# Patient Record
Sex: Male | Born: 1960 | Race: Black or African American | Hispanic: No | Smoking: Former smoker
Health system: Southern US, Community
[De-identification: ages and names within clinical notes are randomized; demographics above are authoritative.]

## PROBLEM LIST (undated history)

## (undated) DIAGNOSIS — I499 Cardiac arrhythmia, unspecified: Secondary | ICD-10-CM

## (undated) DIAGNOSIS — F17200 Nicotine dependence, unspecified, uncomplicated: Secondary | ICD-10-CM

---
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
## 2018-10-25 DIAGNOSIS — R490 Dysphonia: Secondary | ICD-10-CM | POA: Diagnosis present

## 2018-12-01 ENCOUNTER — Emergency Department (HOSPITAL_COMMUNITY): Payer: Self-pay | Admitting: Anesthesiology

## 2018-12-01 ENCOUNTER — Emergency Department (HOSPITAL_COMMUNITY): Payer: Self-pay

## 2018-12-01 ENCOUNTER — Encounter (HOSPITAL_COMMUNITY)
Admission: EM | Disposition: A | Discharge status: Home / Self Care | Payer: Self-pay | Source: Home / Self Care | Attending: Orthopedic Surgery

## 2018-12-01 ENCOUNTER — Encounter (HOSPITAL_COMMUNITY): Payer: Self-pay | Admitting: Emergency Medicine

## 2018-12-01 ENCOUNTER — Other Ambulatory Visit: Payer: Self-pay

## 2018-12-01 ENCOUNTER — Inpatient Hospital Stay (HOSPITAL_COMMUNITY)
Admission: EM | Admit: 2018-12-01 | Discharge: 2018-12-04 | DRG: 493 | Disposition: A | Discharge status: Home / Self Care | Payer: Self-pay | Attending: Orthopedic Surgery | Admitting: Orthopedic Surgery

## 2018-12-01 DIAGNOSIS — R609 Edema, unspecified: Secondary | ICD-10-CM

## 2018-12-01 DIAGNOSIS — S82309A Unspecified fracture of lower end of unspecified tibia, initial encounter for closed fracture: Secondary | ICD-10-CM | POA: Diagnosis present

## 2018-12-01 DIAGNOSIS — Z09 Encounter for follow-up examination after completed treatment for conditions other than malignant neoplasm: Secondary | ICD-10-CM

## 2018-12-01 DIAGNOSIS — D62 Acute posthemorrhagic anemia: Secondary | ICD-10-CM | POA: Diagnosis not present

## 2018-12-01 DIAGNOSIS — Z23 Encounter for immunization: Secondary | ICD-10-CM

## 2018-12-01 DIAGNOSIS — F17200 Nicotine dependence, unspecified, uncomplicated: Secondary | ICD-10-CM | POA: Diagnosis present

## 2018-12-01 DIAGNOSIS — F17210 Nicotine dependence, cigarettes, uncomplicated: Secondary | ICD-10-CM | POA: Diagnosis present

## 2018-12-01 DIAGNOSIS — S82832A Other fracture of upper and lower end of left fibula, initial encounter for closed fracture: Secondary | ICD-10-CM | POA: Diagnosis present

## 2018-12-01 DIAGNOSIS — Z20828 Contact with and (suspected) exposure to other viral communicable diseases: Secondary | ICD-10-CM | POA: Diagnosis present

## 2018-12-01 DIAGNOSIS — S82409A Unspecified fracture of shaft of unspecified fibula, initial encounter for closed fracture: Secondary | ICD-10-CM | POA: Diagnosis present

## 2018-12-01 DIAGNOSIS — Y92018 Other place in single-family (private) house as the place of occurrence of the external cause: Secondary | ICD-10-CM

## 2018-12-01 DIAGNOSIS — Y93H3 Activity, building and construction: Secondary | ICD-10-CM

## 2018-12-01 DIAGNOSIS — Z419 Encounter for procedure for purposes other than remedying health state, unspecified: Secondary | ICD-10-CM

## 2018-12-01 DIAGNOSIS — S82892A Other fracture of left lower leg, initial encounter for closed fracture: Secondary | ICD-10-CM

## 2018-12-01 DIAGNOSIS — K219 Gastro-esophageal reflux disease without esophagitis: Secondary | ICD-10-CM | POA: Diagnosis present

## 2018-12-01 DIAGNOSIS — R490 Dysphonia: Secondary | ICD-10-CM | POA: Diagnosis present

## 2018-12-01 DIAGNOSIS — S82872A Displaced pilon fracture of left tibia, initial encounter for closed fracture: Principal | ICD-10-CM | POA: Diagnosis present

## 2018-12-01 DIAGNOSIS — W19XXXA Unspecified fall, initial encounter: Secondary | ICD-10-CM

## 2018-12-01 DIAGNOSIS — W11XXXA Fall on and from ladder, initial encounter: Secondary | ICD-10-CM | POA: Diagnosis present

## 2018-12-01 DIAGNOSIS — E8889 Other specified metabolic disorders: Secondary | ICD-10-CM | POA: Diagnosis present

## 2018-12-01 HISTORY — PX: EXTERNAL FIXATION LEG: SHX1549

## 2018-12-01 LAB — I-STAT CHEM 8, ED
BUN: 15 mg/dL (ref 6–20)
Calcium, Ion: 1.18 mmol/L (ref 1.15–1.40)
Chloride: 101 mmol/L (ref 98–111)
Creatinine, Ser: 1.1 mg/dL (ref 0.61–1.24)
Glucose, Bld: 128 mg/dL — ABNORMAL HIGH (ref 70–99)
HCT: 49 % (ref 39.0–52.0)
Hemoglobin: 16.7 g/dL (ref 13.0–17.0)
Potassium: 3.9 mmol/L (ref 3.5–5.1)
Sodium: 139 mmol/L (ref 135–145)
TCO2: 26 mmol/L (ref 22–32)

## 2018-12-01 LAB — COMPREHENSIVE METABOLIC PANEL
ALT: 26 U/L (ref 0–44)
AST: 26 U/L (ref 15–41)
Albumin: 4.3 g/dL (ref 3.5–5.0)
Alkaline Phosphatase: 54 U/L (ref 38–126)
Anion gap: 13 (ref 5–15)
BUN: 13 mg/dL (ref 6–20)
CO2: 24 mmol/L (ref 22–32)
Calcium: 9.7 mg/dL (ref 8.9–10.3)
Chloride: 100 mmol/L (ref 98–111)
Creatinine, Ser: 1.16 mg/dL (ref 0.61–1.24)
GFR calc Af Amer: >60 mL/min (ref >60)
GFR calc non Af Amer: >60 mL/min (ref >60)
Glucose, Bld: 132 mg/dL — ABNORMAL HIGH (ref 70–99)
Potassium: 4 mmol/L (ref 3.5–5.1)
Sodium: 137 mmol/L (ref 135–145)
Total Bilirubin: 0.7 mg/dL (ref 0.3–1.2)
Total Protein: 8.4 g/dL — ABNORMAL HIGH (ref 6.5–8.1)

## 2018-12-01 LAB — CBC
HCT: 45.6 % (ref 39.0–52.0)
Hemoglobin: 15.3 g/dL (ref 13.0–17.0)
MCH: 30 pg (ref 26.0–34.0)
MCHC: 33.6 g/dL (ref 30.0–36.0)
MCV: 89.4 fL (ref 80.0–100.0)
Platelets: 272 10*3/uL (ref 150–400)
RBC: 5.1 MIL/uL (ref 4.22–5.81)
RDW: 14.3 % (ref 11.5–15.5)
WBC: 8.7 10*3/uL (ref 4.0–10.5)
nRBC: 0 % (ref 0.0–0.2)

## 2018-12-01 LAB — SARS CORONAVIRUS 2 BY RT PCR (HOSPITAL ORDER, PERFORMED IN ~~LOC~~ HOSPITAL LAB): SARS Coronavirus 2: NEGATIVE

## 2018-12-01 LAB — SAMPLE TO BLOOD BANK

## 2018-12-01 LAB — URINALYSIS, ROUTINE W REFLEX MICROSCOPIC
Bilirubin Urine: NEGATIVE
Glucose, UA: NEGATIVE mg/dL
Hgb urine dipstick: NEGATIVE
Ketones, ur: NEGATIVE mg/dL
Leukocytes,Ua: NEGATIVE
Nitrite: NEGATIVE
Protein, ur: NEGATIVE mg/dL
Specific Gravity, Urine: >1.046 — ABNORMAL HIGH (ref 1.005–1.030)
pH: 5 (ref 5.0–8.0)

## 2018-12-01 LAB — CDS SEROLOGY

## 2018-12-01 LAB — ETHANOL: Alcohol, Ethyl (B): <10 mg/dL (ref <10)

## 2018-12-01 LAB — LACTIC ACID, PLASMA: Lactic Acid, Venous: 1.3 mmol/L (ref 0.5–1.9)

## 2018-12-01 LAB — PROTIME-INR
INR: 1 (ref 0.8–1.2)
Prothrombin Time: 12.7 seconds (ref 11.4–15.2)

## 2018-12-01 SURGERY — EXTERNAL FIXATION, LOWER EXTREMITY
Anesthesia: General | Site: Ankle | Laterality: Left

## 2018-12-01 MED ORDER — MORPHINE SULFATE (PF) 4 MG/ML IV SOLN
4.0000 mg | Freq: Once | INTRAVENOUS | Status: AC
  Administered 2018-12-01: 17:00:00 4 mg via INTRAVENOUS
  Filled 2018-12-01: qty 1

## 2018-12-01 MED ORDER — ONDANSETRON HCL 4 MG/2ML IJ SOLN
INTRAMUSCULAR | Status: DC | PRN
  Administered 2018-12-01: 4 mg via INTRAVENOUS

## 2018-12-01 MED ORDER — ONDANSETRON HCL 4 MG/2ML IJ SOLN
INTRAMUSCULAR | Status: AC
  Filled 2018-12-01: qty 2

## 2018-12-01 MED ORDER — HYDROMORPHONE HCL 1 MG/ML IJ SOLN: 0.2500 mg | INTRAMUSCULAR | Status: DC | PRN

## 2018-12-01 MED ORDER — ARTIFICIAL TEARS OPHTHALMIC OINT
TOPICAL_OINTMENT | OPHTHALMIC | Status: AC
  Filled 2018-12-01: qty 3.5

## 2018-12-01 MED ORDER — SODIUM CHLORIDE (PF) 0.9 % IJ SOLN
INTRAMUSCULAR | Status: AC
  Filled 2018-12-01: qty 10

## 2018-12-01 MED ORDER — SUCCINYLCHOLINE CHLORIDE 20 MG/ML IJ SOLN
INTRAMUSCULAR | Status: DC | PRN
  Administered 2018-12-01: 120 mg via INTRAVENOUS

## 2018-12-01 MED ORDER — PROPOFOL 10 MG/ML IV BOLUS
INTRAVENOUS | Status: DC | PRN
  Administered 2018-12-01: 160 mg via INTRAVENOUS

## 2018-12-01 MED ORDER — SUGAMMADEX SODIUM 200 MG/2ML IV SOLN
INTRAVENOUS | Status: DC | PRN
  Administered 2018-12-01: 200 mg via INTRAVENOUS

## 2018-12-01 MED ORDER — IOHEXOL 300 MG/ML  SOLN
100.0000 mL | Freq: Once | INTRAMUSCULAR | Status: AC | PRN
  Administered 2018-12-01: 100 mL via INTRAVENOUS

## 2018-12-01 MED ORDER — LACTATED RINGERS IV SOLN: INTRAVENOUS | Status: DC

## 2018-12-01 MED ORDER — HYDROMORPHONE HCL 1 MG/ML IJ SOLN
1.0000 mg | Freq: Once | INTRAMUSCULAR | Status: AC
  Administered 2018-12-01: 18:00:00 1 mg via INTRAVENOUS
  Filled 2018-12-01: qty 1

## 2018-12-01 MED ORDER — BACITRACIN ZINC 500 UNIT/GM EX OINT
TOPICAL_OINTMENT | CUTANEOUS | Status: AC
  Filled 2018-12-01: qty 28.35

## 2018-12-01 MED ORDER — PROMETHAZINE HCL 25 MG/ML IJ SOLN: 6.2500 mg | INTRAMUSCULAR | Status: DC | PRN

## 2018-12-01 MED ORDER — ROCURONIUM BROMIDE 10 MG/ML (PF) SYRINGE
PREFILLED_SYRINGE | INTRAVENOUS | Status: AC
  Filled 2018-12-01: qty 20

## 2018-12-01 MED ORDER — DEXAMETHASONE SODIUM PHOSPHATE 10 MG/ML IJ SOLN
INTRAMUSCULAR | Status: DC | PRN
  Administered 2018-12-01: 10 mg via INTRAVENOUS

## 2018-12-01 MED ORDER — STERILE WATER FOR IRRIGATION IR SOLN
Status: DC | PRN
  Administered 2018-12-01: 1000 mL

## 2018-12-01 MED ORDER — FENTANYL CITRATE (PF) 250 MCG/5ML IJ SOLN
INTRAMUSCULAR | Status: AC
  Filled 2018-12-01: qty 5

## 2018-12-01 MED ORDER — ROCURONIUM BROMIDE 100 MG/10ML IV SOLN
INTRAVENOUS | Status: DC | PRN
  Administered 2018-12-01: 40 mg via INTRAVENOUS

## 2018-12-01 MED ORDER — MIDAZOLAM HCL 2 MG/2ML IJ SOLN
INTRAMUSCULAR | Status: AC
  Filled 2018-12-01: qty 2

## 2018-12-01 MED ORDER — SODIUM CHLORIDE 0.9 % IV BOLUS
1000.0000 mL | Freq: Once | INTRAVENOUS | Status: AC
  Administered 2018-12-01: 1000 mL via INTRAVENOUS

## 2018-12-01 MED ORDER — 0.9 % SODIUM CHLORIDE (POUR BTL) OPTIME
TOPICAL | Status: DC | PRN
  Administered 2018-12-01: 22:00:00 1000 mL

## 2018-12-01 MED ORDER — ALBUTEROL SULFATE (2.5 MG/3ML) 0.083% IN NEBU
INHALATION_SOLUTION | RESPIRATORY_TRACT | Status: AC
  Filled 2018-12-01: qty 3

## 2018-12-01 MED ORDER — CEFAZOLIN SODIUM-DEXTROSE 2-3 GM-%(50ML) IV SOLR
INTRAVENOUS | Status: DC | PRN
  Administered 2018-12-01: 2 g via INTRAVENOUS

## 2018-12-01 MED ORDER — PROPOFOL 10 MG/ML IV BOLUS
INTRAVENOUS | Status: AC
  Filled 2018-12-01: qty 40

## 2018-12-01 MED ORDER — ACETAMINOPHEN 10 MG/ML IV SOLN: 1000.0000 mg | Freq: Once | INTRAVENOUS | Status: DC | PRN

## 2018-12-01 MED ORDER — MEPERIDINE HCL 25 MG/ML IJ SOLN: 6.2500 mg | INTRAMUSCULAR | Status: DC | PRN

## 2018-12-01 MED ORDER — MIDAZOLAM HCL 5 MG/5ML IJ SOLN
INTRAMUSCULAR | Status: DC | PRN
  Administered 2018-12-01: 2 mg via INTRAVENOUS

## 2018-12-01 MED ORDER — FENTANYL CITRATE (PF) 250 MCG/5ML IJ SOLN
INTRAMUSCULAR | Status: DC | PRN
  Administered 2018-12-01: 100 ug via INTRAVENOUS
  Administered 2018-12-01: 50 ug via INTRAVENOUS
  Administered 2018-12-01: 100 ug via INTRAVENOUS

## 2018-12-01 MED ORDER — SUCCINYLCHOLINE CHLORIDE 200 MG/10ML IV SOSY
PREFILLED_SYRINGE | INTRAVENOUS | Status: AC
  Filled 2018-12-01: qty 10

## 2018-12-01 MED ORDER — ACETAMINOPHEN 325 MG PO TABS: 325.0000 mg | ORAL_TABLET | Freq: Once | ORAL | Status: DC | PRN

## 2018-12-01 MED ORDER — LIDOCAINE HCL (CARDIAC) PF 100 MG/5ML IV SOSY
PREFILLED_SYRINGE | INTRAVENOUS | Status: DC | PRN
  Administered 2018-12-01: 80 mg via INTRATRACHEAL

## 2018-12-01 MED ORDER — ACETAMINOPHEN 160 MG/5ML PO SOLN: 325.0000 mg | Freq: Once | ORAL | Status: DC | PRN

## 2018-12-01 MED ORDER — DEXAMETHASONE SODIUM PHOSPHATE 10 MG/ML IJ SOLN
INTRAMUSCULAR | Status: AC
  Filled 2018-12-01: qty 1

## 2018-12-01 MED ORDER — CEFAZOLIN SODIUM 1 G IJ SOLR
INTRAMUSCULAR | Status: AC
  Filled 2018-12-01: qty 20

## 2018-12-01 MED ORDER — ALBUTEROL SULFATE (2.5 MG/3ML) 0.083% IN NEBU
2.5000 mg | INHALATION_SOLUTION | Freq: Once | RESPIRATORY_TRACT | Status: AC
  Administered 2018-12-01: 2.5 mg via RESPIRATORY_TRACT

## 2018-12-01 MED ORDER — LACTATED RINGERS IV SOLN
INTRAVENOUS | Status: DC | PRN
  Administered 2018-12-01: 22:00:00 via INTRAVENOUS

## 2018-12-01 SURGICAL SUPPLY — 58 items
BAR GLASS FIBER EXFX 11X350 (EXFIX) ×4 IMPLANT
BIT DRILL CANN MED FLUTE 4.0 (BIT) ×1 IMPLANT
BLADE SURG 10 STRL SS (BLADE) IMPLANT
BNDG COHESIVE 4X5 TAN STRL (GAUZE/BANDAGES/DRESSINGS) IMPLANT
BNDG COHESIVE 6X5 TAN STRL LF (GAUZE/BANDAGES/DRESSINGS) IMPLANT
BNDG ELASTIC 4X5.8 VLCR STR LF (GAUZE/BANDAGES/DRESSINGS) ×2 IMPLANT
BNDG GAUZE ELAST 4 BULKY (GAUZE/BANDAGES/DRESSINGS) ×2 IMPLANT
CANISTER SUCT 3000ML PPV (MISCELLANEOUS) ×2 IMPLANT
CHLORAPREP W/TINT 26 (MISCELLANEOUS) ×2 IMPLANT
CLAMP BLUE BAR TO PIN (EXFIX) ×4 IMPLANT
COVER SURGICAL LIGHT HANDLE (MISCELLANEOUS) ×2 IMPLANT
COVER WAND RF STERILE (DRAPES) IMPLANT
CUFF TOURN SGL QUICK 34 (TOURNIQUET CUFF) ×1
CUFF TOURN SGL QUICK 42 (TOURNIQUET CUFF) IMPLANT
CUFF TRNQT CYL 34X4.125X (TOURNIQUET CUFF) ×1 IMPLANT
DRAPE C-ARM 42X72 X-RAY (DRAPES) ×2 IMPLANT
DRAPE C-ARMOR (DRAPES) ×2 IMPLANT
DRAPE U-SHAPE 47X51 STRL (DRAPES) ×2 IMPLANT
DRILL CANN 4.0MM (BIT) ×2
DRSG ADAPTIC 3X8 NADH LF (GAUZE/BANDAGES/DRESSINGS) IMPLANT
DRSG PAD ABDOMINAL 8X10 ST (GAUZE/BANDAGES/DRESSINGS) IMPLANT
ELECT REM PT RETURN 9FT ADLT (ELECTROSURGICAL) ×2
ELECTRODE REM PT RTRN 9FT ADLT (ELECTROSURGICAL) ×1 IMPLANT
GAUZE SPONGE 4X4 12PLY STRL (GAUZE/BANDAGES/DRESSINGS) IMPLANT
GLOVE BIO SURGEON STRL SZ8 (GLOVE) ×4 IMPLANT
GLOVE BIOGEL PI IND STRL 8 (GLOVE) ×3 IMPLANT
GLOVE BIOGEL PI INDICATOR 8 (GLOVE) ×3
GLOVE ECLIPSE 8.0 STRL XLNG CF (GLOVE) IMPLANT
GLOVE SURG SS PI 6.5 STRL IVOR (GLOVE) ×2 IMPLANT
GLOVE SURG SS PI 7.0 STRL IVOR (GLOVE) ×2 IMPLANT
GOWN STRL REUS W/ TWL LRG LVL3 (GOWN DISPOSABLE) ×2 IMPLANT
GOWN STRL REUS W/ TWL XL LVL3 (GOWN DISPOSABLE) ×2 IMPLANT
GOWN STRL REUS W/TWL LRG LVL3 (GOWN DISPOSABLE) ×2
GOWN STRL REUS W/TWL XL LVL3 (GOWN DISPOSABLE) ×2
KIT BASIN OR (CUSTOM PROCEDURE TRAY) ×2 IMPLANT
KIT TURNOVER KIT B (KITS) ×2 IMPLANT
NEEDLE 22X1 1/2 (OR ONLY) (NEEDLE) IMPLANT
NS IRRIG 1000ML POUR BTL (IV SOLUTION) ×2 IMPLANT
PACK ORTHO EXTREMITY (CUSTOM PROCEDURE TRAY) ×2 IMPLANT
PAD ARMBOARD 7.5X6 YLW CONV (MISCELLANEOUS) ×2 IMPLANT
PAD CAST 4YDX4 CTTN HI CHSV (CAST SUPPLIES) IMPLANT
PADDING CAST COTTON 4X4 STRL (CAST SUPPLIES)
PIN 6X200 (EXFIX) ×2 IMPLANT
PIN CLAMP 2BAR 75MM BLUE (EXFIX) ×2 IMPLANT
PIN HALF YELLOW 5X160X35 (EXFIX) ×4 IMPLANT
SOAP 2 % CHG 4 OZ (WOUND CARE) IMPLANT
STAPLER VISISTAT 35W (STAPLE) IMPLANT
SUCTION FRAZIER HANDLE 10FR (MISCELLANEOUS)
SUCTION TUBE FRAZIER 10FR DISP (MISCELLANEOUS) IMPLANT
SUT ETHILON 3 0 PS 1 (SUTURE) ×2 IMPLANT
SUT PROLENE 3 0 PS 2 (SUTURE) IMPLANT
SUT VIC AB 3-0 PS2 18 (SUTURE)
SUT VIC AB 3-0 PS2 18XBRD (SUTURE) IMPLANT
SYR CONTROL 10ML LL (SYRINGE) IMPLANT
TOWEL GREEN STERILE (TOWEL DISPOSABLE) ×2 IMPLANT
TOWEL GREEN STERILE FF (TOWEL DISPOSABLE) ×2 IMPLANT
TUBE CONNECTING 12X1/4 (SUCTIONS) IMPLANT
WATER STERILE IRR 1000ML POUR (IV SOLUTION) ×2 IMPLANT

## 2018-12-01 NOTE — ED Notes (Signed)
Patient transported to CT 

## 2018-12-01 NOTE — Anesthesia Procedure Notes (Signed)
Procedure Name: Intubation Date/Time: 12/01/2018 10:41 PM Performed by: Clovis Cao, CRNA Pre-anesthesia Checklist: Patient identified, Emergency Drugs available, Suction available, Patient being monitored and Timeout performed Patient Re-evaluated:Patient Re-evaluated prior to induction Oxygen Delivery Method: Circle system utilized Preoxygenation: Pre-oxygenation with 100% oxygen Induction Type: IV induction, Rapid sequence and Cricoid Pressure applied Laryngoscope Size: Miller and 2 Grade View: Grade I Tube type: Oral Tube size: 7.5 mm Number of attempts: 1 Airway Equipment and Method: Stylet Placement Confirmation: ETT inserted through vocal cords under direct vision,  positive ETCO2 and breath sounds checked- equal and bilateral Secured at: 22 cm Tube secured with: Tape Dental Injury: Teeth and Oropharynx as per pre-operative assessment

## 2018-12-01 NOTE — Transfer of Care (Signed)
Immediate Anesthesia Transfer of Care Note  Patient: Kevin Powers  Procedure(s) Performed: CLOSED REDUCTION, EXTERNAL FIXATION LEFT ANKLE (Left )  Patient Location: PACU  Anesthesia Type:General  Level of Consciousness: drowsy  Airway & Oxygen Therapy: Patient Spontanous Breathing and Patient connected to nasal cannula oxygen  Post-op Assessment: Report given to RN and Post -op Vital signs reviewed and stable  Post vital signs: Reviewed and stable  Last Vitals:  Vitals Value Taken Time  BP 156/114 12/01/18 2345  Temp 36.5 C 12/01/18 2340  Pulse 90 12/01/18 2350  Resp 21 12/01/18 2350  SpO2 98 % 12/01/18 2350  Vitals shown include unvalidated device data.  Last Pain:  Vitals:   12/01/18 1733  PainSc: 10-Worst pain ever         Complications: No apparent anesthesia complications

## 2018-12-01 NOTE — Progress Notes (Signed)
Orthopedic Tech Progress Note Patient Details:  Kevin Powers 03-10-1960 [id number]  Level 2 trauma Patient ID: [name], male   DOB: September 29, 1960, 58 y.o.   MRN: [id number]   [name] 12/01/2018, 5:10 PM

## 2018-12-01 NOTE — Op Note (Signed)
12/01/2018  11:27 PM  PATIENT:  Kevin Powers  58 y.o. male  PRE-OPERATIVE DIAGNOSIS: 1.  Left distal tibia intra-articular fracture      2.  Left fibula lateral malleolus fracture  POST-OPERATIVE DIAGNOSIS:  same  Procedure(s): 1.  Closed treatment of left distal tibia intra-articular fracture with manipulation under anesthesia   2.  Closed treatment of left distal fibula fracture with manipulation under anesthesia   3.  Application of multi plane external fixation to left lower extremity  SURGEON:  Wylene Simmer, MD  ASSISTANT: Mechele Claude, PA-C  ANESTHESIA:   General  EBL:  minimal   TOURNIQUET:  none  COMPLICATIONS:  None apparent  DISPOSITION:  Extubated, awake and stable to recovery.  INDICATION FOR PROCEDURE: The patient is a 58 year old male with past medical history significant for smoking.  He fell from a ladder earlier today injuring his left lower extremity.  Plain films and a CT scan show a comminuted distal tibia pilon fracture and a comminuted lateral malleolus fracture of the fibula.  He presents now for closed reduction and application of an external fixator.  The risks and benefits of the alternative treatment options have been discussed in detail.  The patient wishes to proceed with surgery and specifically understands risks of bleeding, infection, nerve damage, blood clots, need for additional surgery, amputation and death.  PROCEDURE IN DETAIL: After preoperative consent was obtained and the correct operative site was identified, the patient was brought to the operating room and placed upon the operating table.  Preoperative antibiotics were administered.  A surgical timeout was taken.  General anesthesia was administered.  The left lower extremity was then prepped and draped in standard sterile fashion.  2 Schanz pins were inserted in the tibia after predrilling.  A pin to bar clamp was applied and provisionally tightened.  A stab incision was made at the lateral wall  of the calcaneus at the isthmus of the tuberosity.  A centrally threaded transfixion pin was then inserted taking care to avoid the neurovascular bundle medially.  Pin to bar clamps were applied.  Carbon fiber rods from the Zimmer large ex-fix set were attached.  Longitudinal traction was applied along with medial and posterior translation of the foot relative to the leg.  AP and lateral radiographs showed appropriate alignment of the tibiotalar joint and appropriate length of the tibia and fibula.  The clamps were all securely tightened.  The drill tip was cut off with bolt cutters.  All metal prominences were capped.  The wounds were irrigated.  Sterile dressings were applied around the pins followed by Ace wrap around the foot and leg.  The patient was then awakened from anesthesia and transported to the recovery room in stable condition.  FOLLOW UP PLAN: The patient will be admitted for pain control overnight and for physical therapy and a CT scan tomorrow morning.  He will require a return to the operating room for staged treatment of this severely comminuted intra-articular fractures of the distal tibia and fibula.    Mechele Claude PA-C was present and scrubbed for the duration of the operative case. His assistance was essential in positioning the patient, prepping and draping, gaining and maintaining exposure, performing the operation, closing and dressing the wounds and applying the splint.

## 2018-12-01 NOTE — ED Notes (Signed)
Pt remains alert and oriented.  C/o pain remaining a '10", additional orders given per MD.

## 2018-12-01 NOTE — ED Provider Notes (Signed)
[facility] Provider Note   CSN: [id number] Arrival date & time: 12/01/18  1620     History   Chief Complaint No chief complaint on file.   HPI Aris GeorgiaJohn Jerrett is a 58 y.o. male here presenting with 15 foot fall.  Patient states that he was trying to take out a window and was on top of the ladder and slipped and fell backwards.  He states that he fell directly on his left foot and was noted to have a obvious left distal tib-fib deformity.  He denies any head injury and denies any back pain or other injuries.  Patient states that he is not on any blood thinners currently.     The history is provided by the patient.    History reviewed. No pertinent past medical history.  There are no active problems to display for this patient.   History reviewed. No pertinent surgical history.      Home Medications    Prior to Admission medications   Not on File    Family History No family history on file.  Social History Social History   Tobacco Use  . Smoking status: Not on file  Substance Use Topics  . Alcohol use: Not on file  . Drug use: Not on file     Allergies   Patient has no allergy information on record.   Review of Systems Review of Systems  Musculoskeletal:       L leg pain   All other systems reviewed and are negative.    Physical Exam Updated Vital Signs BP 135/90   Pulse 73   Resp 11   SpO2 93%   Physical Exam Vitals signs and nursing note reviewed.  Constitutional:      Comments: Uncomfortable   HENT:     Head: Normocephalic.     Comments: No obvious scalp hematoma     Mouth/Throat:     Mouth: Mucous membranes are moist.  Eyes:     Extraocular Movements: Extraocular movements intact.     Pupils: Pupils are equal, round, and reactive to light.  Neck:     Musculoskeletal: Normal range of motion.  Cardiovascular:     Rate and Rhythm: Normal rate and regular rhythm.     Pulses: Normal pulses.     Heart  sounds: Normal heart sounds.  Pulmonary:     Effort: Pulmonary effort is normal.     Breath sounds: Normal breath sounds.  Abdominal:     General: Abdomen is flat.     Palpations: Abdomen is soft.     Comments: No obvious abdominal bruising or ecchymosis   Musculoskeletal:     Comments: No obvious spinal tenderness or deformity. Obvious L distal tib/fib deformity and L ankle deformity. Good DP pulse able to wiggle toes.   Skin:    General: Skin is warm.     Capillary Refill: Capillary refill takes less than 2 seconds.  Neurological:     General: No focal deficit present.  Psychiatric:        Mood and Affect: Mood normal.        Behavior: Behavior normal.      ED Treatments / Results  Labs (all labs ordered are listed, but only abnormal results are displayed) Labs Reviewed  COMPREHENSIVE METABOLIC PANEL - Abnormal; Notable for the following components:      Result Value   Glucose, Bld 132 (*)    Total Protein 8.4 (*)  All other components within normal limits  I-STAT CHEM 8, ED - Abnormal; Notable for the following components:   Glucose, Bld 128 (*)    All other components within normal limits  CDS SEROLOGY  CBC  ETHANOL  LACTIC ACID, PLASMA  PROTIME-INR  URINALYSIS, ROUTINE W REFLEX MICROSCOPIC  SAMPLE TO BLOOD BANK    EKG None  Radiology Dg Pelvis Portable  Result Date: 12/01/2018 CLINICAL DATA:  Fall from second story. EXAM: PORTABLE PELVIS 1-2 VIEWS COMPARISON:  None. FINDINGS: Frontal pelvis shows no fracture. No subluxation or dislocation. SI joints and symphysis pubis are unremarkable. Radiopaque debris overlies the right hemipelvis, presumably external to the patient. Foreign bodies also project over the proximal right thigh. IMPRESSION: Negative. Electronically Signed   By: Kennith Center M.D.   On: 12/01/2018 17:27   Dg Chest Port 1 View  Result Date: 12/01/2018 CLINICAL DATA:  Fall from second story. EXAM: PORTABLE CHEST 1 VIEW COMPARISON:  None.  FINDINGS: 1646 hours. The lungs are clear without focal pneumonia, edema, pneumothorax or pleural effusion. The cardiopericardial silhouette is within normal limits for size. The visualized bony structures of the thorax are intact. Telemetry leads overlie the chest. IMPRESSION: No active disease. Electronically Signed   By: Kennith Center M.D.   On: 12/01/2018 17:25   Dg Tibia/fibula Left Port  Result Date: 12/01/2018 CLINICAL DATA:  Ankle fracture EXAM: PORTABLE LEFT TIBIA AND FIBULA - 2 VIEW COMPARISON:  None. FINDINGS: No fracture the proximal tibia or fibula. Complex fracture of the distal tibia fibula about the ankle joint. IMPRESSION: Complex distal tibia and fibular fractures.  No proximal fracture Electronically Signed   By: Genevive Bi M.D.   On: 12/01/2018 17:40   Dg Ankle Left Port  Result Date: 12/01/2018 CLINICAL DATA:  Patient fell from second story ladder today, pain and deformity to left ankle. EXAM: PORTABLE LEFT ANKLE - 2 VIEW COMPARISON:  None. FINDINGS: Fracture of the distal tibial metaphysis with anterior angulation. Fracture plane enters the tibial talar articular surface. Comminuted fracture fragments about the fracture plane. Comminuted fracture of the fibula just above the metaphysis. The ankle mortise grossly intact. No calcaneus fracture identified. IMPRESSION: 1. Complex comminuted intra-articular fracture of the tibial metaphysis with angulation and compaction. 2. Comminuted fracture of the distal fibula above the metaphysis. 3. Ankle mortise grossly intact. Electronically Signed   By: Genevive Bi M.D.   On: 12/01/2018 17:30    Procedures Procedures (including critical care time)  CRITICAL CARE Performed by: Richardean Canal   Total critical care time: 30 minutes  Critical care time was exclusive of separately billable procedures and treating other patients.  Critical care was necessary to treat or prevent imminent or life-threatening deterioration.  Critical  care was time spent personally by me on the following activities: development of treatment plan with patient and/or surrogate as well as nursing, discussions with consultants, evaluation of patient's response to treatment, examination of patient, obtaining history from patient or surrogate, ordering and performing treatments and interventions, ordering and review of laboratory studies, ordering and review of radiographic studies, pulse oximetry and re-evaluation of patient's condition.   Medications Ordered in ED Medications  sodium chloride 0.9 % bolus 1,000 mL (1,000 mLs Intravenous New Bag/Given 12/01/18 1741)  morphine 4 MG/ML injection 4 mg (4 mg Intravenous Given 12/01/18 1659)  HYDROmorphone (DILAUDID) injection 1 mg (1 mg Intravenous Given 12/01/18 1740)  iohexol (OMNIPAQUE) 300 MG/ML solution 100 mL (100 mLs Intravenous Contrast Given 12/01/18 1810)  Initial Impression / Assessment and Plan / ED Course  I have reviewed the triage vital signs and the nursing notes.  Pertinent labs & imaging results that were available during my care of the patient were reviewed by me and considered in my medical decision making (see chart for details).       Joquan Flavell is a 58 y.o. male here with L ankle injury, 15 foot fall. Level 2 trauma activated for mechanism. Will get xrays, trauma labs and trauma scan. Will give pain meds.   8 pm Xrays showed distal fibula and complicated ankle fracture. No other traumatic injuries. Talked to Dr. Doran Durand from ortho. He will see patient and will take patient to the OR for surgery.    Final Clinical Impressions(s) / ED Diagnoses   Final diagnoses:  Fall  Fall    ED Discharge Orders    None       Drenda Freeze, MD 12/01/18 2103

## 2018-12-01 NOTE — ED Triage Notes (Signed)
[facility]- pt here due to a 15 foot fall off a ladder. Pt has injury to the left foot. Possible fracture or dislocation. Pt given 200 mcg of fentanyl with EMS.    150/100 20 RR 100% RA 80-90 HR

## 2018-12-01 NOTE — Anesthesia Preprocedure Evaluation (Addendum)
Anesthesia Evaluation  Patient identified by MRN, date of birth, ID band Patient awake    Reviewed: Allergy & Precautions, NPO status , Patient's Chart, lab work & pertinent test results  Airway Mallampati: I  TM Distance: >3 FB Neck ROM: Full    Dental  (+) Teeth Intact, Dental Advisory Given   Pulmonary neg pulmonary ROS,    breath sounds clear to auscultation       Cardiovascular negative cardio ROS   Rhythm:Regular Rate:Normal     Neuro/Psych negative neurological ROS     GI/Hepatic negative GI ROS, Neg liver ROS,   Endo/Other  negative endocrine ROS  Renal/GU negative Renal ROS     Musculoskeletal   Abdominal Normal abdominal exam  (+)   Peds  Hematology   Anesthesia Other Findings   Reproductive/Obstetrics                            Anesthesia Physical Anesthesia Plan  ASA: II  Anesthesia Plan: General   Post-op Pain Management:    Induction: Intravenous, Rapid sequence and Cricoid pressure planned  PONV Risk Score and Plan: 3 and Ondansetron, Dexamethasone and Midazolam  Airway Management Planned: Oral ETT  Additional Equipment: None  Intra-op Plan:   Post-operative Plan: Extubation in OR  Informed Consent: I have reviewed the patients History and Physical, chart, labs and discussed the procedure including the risks, benefits and alternatives for the proposed anesthesia with the patient or authorized representative who has indicated his/her understanding and acceptance.     Dental advisory given  Plan Discussed with: CRNA  Anesthesia Plan Comments:        Anesthesia Quick Evaluation

## 2018-12-01 NOTE — ED Notes (Signed)
All trauma charting reflects the time of 1621.   Unable to change times at this point.

## 2018-12-01 NOTE — H&P (Signed)
Kevin Powers is an 58 y.o. male.   Chief Complaint: Dr. Silverio Lay HPI: The patient is a 58 year old male with past medical history significant for cigarette smoking.Marland Kitchen  He fell approximately 15 feet from a ladder earlier today while trying to install a window.  He had immediate pain in his left lower extremity and was unable to bear weight.  He was brought to the emergency room via EMS.  He underwent evaluation with no evidence of other injury aside from the left lower extremity.  Given his mechanism he underwent CT evaluation of his cervical spine, chest, abdomen and pelvis.  He is not on any blood thinners.  He denies any history of diabetes.  He last ate at lunch late this morning.  History reviewed. No pertinent past medical history.  History reviewed. No pertinent surgical history.  History reviewed. No pertinent family history. Social History:  has no history on file for tobacco, alcohol, and drug.  He smokes cigarettes daily.  Allergies: Not on File  (Not in a hospital admission)   Results for orders placed or performed during the hospital encounter of 12/01/18 (from the past 48 hour(s))  CDS serology     Status: None   Collection Time: 12/01/18  4:31 PM  Result Value Ref Range   CDS serology specimen      SPECIMEN WILL BE HELD FOR 14 DAYS IF TESTING IS REQUIRED    Comment: SPECIMEN WILL BE HELD FOR 14 DAYS IF TESTING IS REQUIRED Performed at [facility], [address], [place], [place] [address]   Comprehensive metabolic panel     Status: Abnormal   Collection Time: 12/01/18  4:31 PM  Result Value Ref Range   Sodium 137 135 - 145 mmol/L   Potassium 4.0 3.5 - 5.1 mmol/L   Chloride 100 98 - 111 mmol/L   CO2 24 22 - 32 mmol/L   Glucose, Bld 132 (H) 70 - 99 mg/dL   BUN 13 6 - 20 mg/dL   Creatinine, Ser 6.04 0.61 - 1.24 mg/dL   Calcium 9.7 8.9 - 54.0 mg/dL   Total Protein 8.4 (H) 6.5 - 8.1 g/dL   Albumin 4.3 3.5 - 5.0 g/dL   AST 26 15 - 41 U/L   ALT 26 0 - 44 U/L   Alkaline  Phosphatase 54 38 - 126 U/L   Total Bilirubin 0.7 0.3 - 1.2 mg/dL   GFR calc non Af Amer >60 >60 mL/min   GFR calc Af Amer >60 >60 mL/min   Anion gap 13 5 - 15    Comment: Performed at [facility], [address], [place], [place] [address]  CBC     Status: None   Collection Time: 12/01/18  4:31 PM  Result Value Ref Range   WBC 8.7 4.0 - 10.5 K/uL   RBC 5.10 4.22 - 5.81 MIL/uL   Hemoglobin 15.3 13.0 - 17.0 g/dL   HCT 14.7 82.9 - 56.2 %   MCV 89.4 80.0 - 100.0 fL   MCH 30.0 26.0 - 34.0 pg   MCHC 33.6 30.0 - 36.0 g/dL   RDW 13.0 86.5 - 78.4 %   Platelets 272 150 - 400 K/uL   nRBC 0.0 0.0 - 0.2 %    Comment: Performed at [facility], [address] [place], [place] [address]  Ethanol     Status: None   Collection Time: 12/01/18  4:31 PM  Result Value Ref Range   Alcohol, Ethyl (B) <  10 <10 mg/dL    Comment: Performed at [facility], [address] [place], [place] [address]  Lactic acid, plasma     Status: None   Collection Time: 12/01/18  4:31 PM  Result Value Ref Range   Lactic Acid, Venous 1.3 0.5 - 1.9 mmol/L    Comment: Performed at [facility], [address] [place], [place] [address]  Protime-INR     Status: None   Collection Time: 12/01/18  4:31 PM  Result Value Ref Range   Prothrombin Time 12.7 11.4 - 15.2 seconds   INR 1.0 0.8 - 1.2    Comment: (NOTE) INR goal varies based on device and disease states. Performed at [facility], [address] [place], [place] [address]   Sample to Blood Bank     Status: None   Collection Time: 12/01/18  4:47 PM  Result Value Ref Range   Blood Bank Specimen SAMPLE AVAILABLE FOR TESTING    Sample Expiration      12/02/2018,2359 Performed at [facility], [address] [place], [place] [address]   Dickie LaI-stat chem 8, ED     Status: Abnormal   Collection Time: 12/01/18  4:51 PM  Result Value Ref Range   Sodium 139 135 - 145 mmol/L   Potassium 3.9 3.5 - 5.1 mmol/L   Chloride 101 98 - 111  mmol/L   BUN 15 6 - 20 mg/dL   Creatinine, Ser 2.951.10 0.61 - 1.24 mg/dL   Glucose, Bld 621128 (H) 70 - 99 mg/dL   Calcium, Ion 3.081.18 6.571.15 - 1.40 mmol/L   TCO2 26 22 - 32 mmol/L   Hemoglobin 16.7 13.0 - 17.0 g/dL   HCT 84.649.0 96.239.0 - 95.252.0 %  Urinalysis, Routine w reflex microscopic     Status: Abnormal   Collection Time: 12/01/18  7:45 PM  Result Value Ref Range   Color, Urine STRAW (A) YELLOW   APPearance CLEAR CLEAR   Specific Gravity, Urine >1.046 (H) 1.005 - 1.030   pH 5.0 5.0 - 8.0   Glucose, UA NEGATIVE NEGATIVE mg/dL   Hgb urine dipstick NEGATIVE NEGATIVE   Bilirubin Urine NEGATIVE NEGATIVE   Ketones, ur NEGATIVE NEGATIVE mg/dL   Protein, ur NEGATIVE NEGATIVE mg/dL   Nitrite NEGATIVE NEGATIVE   Leukocytes,Ua NEGATIVE NEGATIVE    Comment: Performed at [facility], [address] [place], [place] [address]  SARS Coronavirus 2 [facility](Hospital order, Performed in [facility] hospital lab) Nasopharyngeal Nasopharyngeal Swab     Status: None   Collection Time: 12/01/18  8:00 PM   Specimen: Nasopharyngeal Swab  Result Value Ref Range   SARS Coronavirus 2 NEGATIVE NEGATIVE    Comment: (NOTE) If result is NEGATIVE SARS-CoV-2 target nucleic acids are NOT DETECTED. The SARS-CoV-2 RNA is generally detectable in upper and lower  respiratory specimens during the acute phase of infection. The lowest  concentration of SARS-CoV-2 viral copies this assay can detect is 250  copies / mL. A negative result does not preclude SARS-CoV-2 infection  and should not be used as the sole basis for treatment or other  patient management decisions.  A negative result may occur with  improper specimen collection / handling, submission of specimen other  than nasopharyngeal swab, presence of viral mutation(s) within the  areas targeted by this assay, and inadequate number of viral copies  (<250 copies / mL). A negative result must be combined with clinical  observations, patient history, and epidemiological  information. If result is POSITIVE SARS-CoV-2 target nucleic acids are DETECTED. The SARS-CoV-2 RNA is generally detectable in upper and lower  respiratory specimens dur ing the acute phase of infection.  Positive  results are indicative of active infection with SARS-CoV-2.  Clinical  correlation with patient history and other diagnostic information is  necessary to determine patient infection status.  Positive results do  not rule out bacterial infection or co-infection with other viruses. If result is PRESUMPTIVE POSTIVE SARS-CoV-2 nucleic acids MAY BE PRESENT.   A presumptive positive result was obtained on the submitted specimen  and confirmed on repeat testing.  While 2019 novel coronavirus  (SARS-CoV-2) nucleic acids may be present in the submitted sample  additional confirmatory testing may be necessary for epidemiological  and / or clinical management purposes  to differentiate between  SARS-CoV-2 and other Sarbecovirus currently known to infect humans.  If clinically indicated additional testing with an alternate test  methodology [phone]) is advised. The SARS-CoV-2 RNA is generally  detectable in upper and lower respiratory sp ecimens during the acute  phase of infection. The expected result is Negative. Fact Sheet for Patients:  BoilerBrush.com.cy Fact Sheet for Healthcare Providers: https://pope.com/ This test is not yet approved or cleared by the [place] FDA and has been authorized for detection and/or diagnosis of SARS-CoV-2 by FDA under an Emergency Use Authorization (EUA).  This EUA will remain in effect (meaning this test can be used) for the duration of the COVID-19 declaration under Section 564(b)(1) of the Act, 21 U.S.C. section 360bbb-3(b)(1), unless the authorization is terminated or revoked sooner. Performed at [facility], [address], [place], [place] [address]    Ct Head Wo  Contrast  Result Date: 12/01/2018 CLINICAL DATA:  Fall from ladder EXAM: CT HEAD WITHOUT CONTRAST TECHNIQUE: Contiguous axial images were obtained from the base of the skull through the vertex without intravenous contrast. COMPARISON:  None. FINDINGS: Brain: There is no mass, hemorrhage or extra-axial collection. The size and configuration of the ventricles and extra-axial CSF spaces are normal. The brain parenchyma is normal, without acute or chronic infarction. Vascular: No abnormal hyperdensity of the major intracranial arteries or dural venous sinuses. No intracranial atherosclerosis. Skull: The visualized skull base, calvarium and extracranial soft tissues are normal. Sinuses/Orbits: Moderate diffuse paranasal sinus disease. No mastoid effusion. The orbits are normal. IMPRESSION: 1. No acute intracranial abnormality. 2. Moderate diffuse paranasal sinus disease.  With Electronically Signed   By: Deatra Robinson M.D.   On: 12/01/2018 19:08   Ct Chest W Contrast  Result Date: 12/01/2018 CLINICAL DATA:  Trauma, 15 foot fall off ladder EXAM: CT ABDOMEN AND PELVIS WITH CONTRAST TECHNIQUE: Multidetector CT imaging of the abdomen and pelvis was performed using the standard protocol following bolus administration of intravenous contrast. CONTRAST:  OMNIPAQUE IOHEXOL 300 MG/ML  SOLN COMPARISON:  None. FINDINGS: Cardiovascular: Normal heart size. No significant pericardial fluid/thickening. Great vessels are normal in course and caliber. No evidence of acute thoracic aortic injury. No central pulmonary emboli. Mediastinum/Nodes: No pneumomediastinum. No mediastinal hematoma. Unremarkable esophagus. No axillary, mediastinal or hilar lymphadenopathy. Lungs/Pleura:Lungs are clear No pneumothorax. No pleural effusion. Musculoskeletal: No fracture seen in the thorax. Abdomen/pelvis Hepatobiliary: Homogeneous hepatic attenuation without traumatic injury. No focal lesion. Gallbladder physiologically distended, no  calcified stone. There are small calcifications a with seen within the distal CBD. No biliary dilatation. Pancreas: No evidence for traumatic injury. Portions are partially obscured by adjacent bowel loops and paucity of intra-abdominal fat. No  ductal dilatation or inflammation. Spleen: Homogeneous attenuation without traumatic injury. Normal in size. Adrenals/Urinary Tract: No adrenal hemorrhage. Kidneys demonstrate symmetric enhancement and excretion on delayed phase imaging. No evidence or renal injury. Ureters are well opacified proximal through mid portion. Bladder is physiologically distended without wall thickening. Stomach/Bowel: Suboptimally assessed without enteric contrast, allowing for this, no evidence of bowel injury. Stomach physiologically distended. There are no dilated or thickened small or large bowel loops. Moderate stool burden. No evidence of mesenteric hematoma. No free air free fluid. Vascular/Lymphatic: No acute vascular injury. The abdominal aorta and IVC are intact. No evidence of retroperitoneal, abdominal, or pelvic adenopathy. Scattered aortic vascular calcifications are seen. Reproductive: No acute abnormality. Other: No focal contusion or abnormality of the abdominal wall. Musculoskeletal: No acute fracture of the lumbar spine or bony pelvis. IMPRESSION: 1.  No acute intrathoracic, abdominal, or pelvic injury. 2. Choledocholithiasis 3. Abdominal aortic atherosclerosis Electronically Signed   By: Prudencio Pair M.D.   On: 12/01/2018 19:18   Ct Cervical Spine Wo Contrast  Result Date: 12/01/2018 CLINICAL DATA:  Fall from ladder EXAM: CT CERVICAL SPINE WITHOUT CONTRAST TECHNIQUE: Multidetector CT imaging of the cervical spine was performed without intravenous contrast. Multiplanar CT image reconstructions were also generated. COMPARISON:  None. FINDINGS: Alignment: No static subluxation. Facets are aligned. Occipital condyles and the lateral masses of C1 and C2 are normally  approximated. Skull base and vertebrae: No acute fracture. Soft tissues and spinal canal: No prevertebral fluid or swelling. No visible canal hematoma. Disc levels: No advanced spinal canal or neural foraminal stenosis. Upper chest: No pneumothorax, pulmonary nodule or pleural effusion. Other: Normal visualized paraspinal cervical soft tissues. IMPRESSION: No acute fracture or static subluxation of the cervical spine. Electronically Signed   By: Ulyses Jarred M.D.   On: 12/01/2018 19:19   Ct Abdomen Pelvis W Contrast  Result Date: 12/01/2018 CLINICAL DATA:  Trauma, 15 foot fall off ladder EXAM: CT ABDOMEN AND PELVIS WITH CONTRAST TECHNIQUE: Multidetector CT imaging of the abdomen and pelvis was performed using the standard protocol following bolus administration of intravenous contrast. CONTRAST:  110mL OMNIPAQUE IOHEXOL 300 MG/ML  SOLN COMPARISON:  None. FINDINGS: Cardiovascular: Normal heart size. No significant pericardial fluid/thickening. Great vessels are normal in course and caliber. No evidence of acute thoracic aortic injury. No central pulmonary emboli. Mediastinum/Nodes: No pneumomediastinum. No mediastinal hematoma. Unremarkable esophagus. No axillary, mediastinal or hilar lymphadenopathy. Lungs/Pleura:Lungs are clear No pneumothorax. No pleural effusion. Musculoskeletal: No fracture seen in the thorax. Abdomen/pelvis Hepatobiliary: Homogeneous hepatic attenuation without traumatic injury. No focal lesion. Gallbladder physiologically distended, no calcified stone. There are small calcifications a with seen within the distal CBD. No biliary dilatation. Pancreas: No evidence for traumatic injury. Portions are partially obscured by adjacent bowel loops and paucity of intra-abdominal fat. No ductal dilatation or inflammation. Spleen: Homogeneous attenuation without traumatic injury. Normal in size. Adrenals/Urinary Tract: No adrenal hemorrhage. Kidneys demonstrate symmetric enhancement and excretion on  delayed phase imaging. No evidence or renal injury. Ureters are well opacified proximal through mid portion. Bladder is physiologically distended without wall thickening. Stomach/Bowel: Suboptimally assessed without enteric contrast, allowing for this, no evidence of bowel injury. Stomach physiologically distended. There are no dilated or thickened small or large bowel loops. Moderate stool burden. No evidence of mesenteric hematoma. No free air free fluid. Vascular/Lymphatic: No acute vascular injury. The abdominal aorta and IVC are intact. No evidence of retroperitoneal, abdominal, or pelvic adenopathy. Scattered aortic vascular calcifications are seen. Reproductive: No acute abnormality. Other: No  focal contusion or abnormality of the abdominal wall. Musculoskeletal: No acute fracture of the lumbar spine or bony pelvis. IMPRESSION: 1.  No acute intrathoracic, abdominal, or pelvic injury. 2. Choledocholithiasis 3. Abdominal aortic atherosclerosis Electronically Signed   By: Jonna Clark M.D.   On: 12/01/2018 19:18   Dg Pelvis Portable  Result Date: 12/01/2018 CLINICAL DATA:  Fall from second story. EXAM: PORTABLE PELVIS 1-2 VIEWS COMPARISON:  None. FINDINGS: Frontal pelvis shows no fracture. No subluxation or dislocation. SI joints and symphysis pubis are unremarkable. Radiopaque debris overlies the right hemipelvis, presumably external to the patient. Foreign bodies also project over the proximal right thigh. IMPRESSION: Negative. Electronically Signed   By: Kennith Center M.D.   On: 12/01/2018 17:27   Ct Ankle Left Wo Contrast  Result Date: 12/01/2018 CLINICAL DATA:  Ankle trauma with fracture and dislocation. Status post fall from a ladder. EXAM: CT OF THE LEFT ANKLE WITHOUT CONTRAST TECHNIQUE: Multidetector CT imaging of the left ankle was performed according to the standard protocol. Multiplanar CT image reconstructions were also generated. COMPARISON:  None. FINDINGS: Bones/Joint/Cartilage Acute  severely comminuted fracture of the distal tibial metaphysis and epiphysis involving the articular surface of the tibial plafond with 7 mm of lateral displacement and apex medial angulation. Major anterior fracture fragment is anteriorly displaced by 11 mm. Minimally displaced medial malleolar fracture. Nondisplaced posterior malleolar fracture. Comminuted fracture of the distal fibular diaphysis with 1 shaft with lateral displacement. Mild apex medial angulation. No dislocation. No other acute fracture.  No joint effusion. Ligaments Ligaments are suboptimally evaluated by CT. Muscles and Tendons Muscles are normal. No muscle atrophy. Posterior tibial tendon and flexor digitorum tendons coursing medially adjacent to the distal tibial fracture with possible entrapment within the fracture cleft. Intact Achilles tendon. Soft tissue No fluid collection or hematoma.  No soft tissue mass. IMPRESSION: Acute severely comminuted fracture of the distal tibial metaphysis and epiphysis involving the articular surface of the tibial plafond with 7 mm of lateral displacement and apex medial angulation. Major anterior fracture fragment is anteriorly displaced by 11 mm. Minimally displaced medial malleolar fracture. Nondisplaced posterior malleolar fracture. Posterior tibial tendon and flexor digitorum tendons coursing medially adjacent to the distal tibial fracture with possible entrapment within the fracture cleft. Comminuted fracture of the distal fibular diaphysis with 1 shaft with lateral displacement. Mild apex medial angulation. Electronically Signed   By: Elige Ko   On: 12/01/2018 19:10   Ct L-spine No Charge  Result Date: 12/01/2018 CLINICAL DATA:  Fall from ladder fall from ladder EXAM: CT LUMBAR SPINE WITHOUT CONTRAST TECHNIQUE: Multidetector CT imaging of the lumbar spine was performed without intravenous contrast administration. Multiplanar CT image reconstructions were also generated. COMPARISON:  None.  FINDINGS: Segmentation: Normal Alignment: Normal Vertebrae: No fracture Paraspinal and other soft tissues: Please see dedicated CT abdomen pelvis Disc levels: No large disc herniation. No spinal canal stenosis. The neural foramina are patent. IMPRESSION: No acute abnormality of the lumbar spine. Electronically Signed   By: Deatra Robinson M.D.   On: 12/01/2018 19:05   Dg Chest Port 1 View  Result Date: 12/01/2018 CLINICAL DATA:  Fall from second story. EXAM: PORTABLE CHEST 1 VIEW COMPARISON:  None. FINDINGS: 1646 hours. The lungs are clear without focal pneumonia, edema, pneumothorax or pleural effusion. The cardiopericardial silhouette is within normal limits for size. The visualized bony structures of the thorax are intact. Telemetry leads overlie the chest. IMPRESSION: No active disease. Electronically Signed   By: Jamison Oka.D.  On: 12-27-18 17:25   Dg Tibia/fibula Left Port  Result Date: 27-Dec-2018 CLINICAL DATA:  Ankle fracture EXAM: PORTABLE LEFT TIBIA AND FIBULA - 2 VIEW COMPARISON:  None. FINDINGS: No fracture the proximal tibia or fibula. Complex fracture of the distal tibia fibula about the ankle joint. IMPRESSION: Complex distal tibia and fibular fractures.  No proximal fracture Electronically Signed   By: Genevive Bi M.D.   On: 12-27-18 17:40   Dg Ankle Left Port  Result Date: 12-27-18 CLINICAL DATA:  Patient fell from second story ladder today, pain and deformity to left ankle. EXAM: PORTABLE LEFT ANKLE - 2 VIEW COMPARISON:  None. FINDINGS: Fracture of the distal tibial metaphysis with anterior angulation. Fracture plane enters the tibial talar articular surface. Comminuted fracture fragments about the fracture plane. Comminuted fracture of the fibula just above the metaphysis. The ankle mortise grossly intact. No calcaneus fracture identified. IMPRESSION: 1. Complex comminuted intra-articular fracture of the tibial metaphysis with angulation and compaction. 2. Comminuted  fracture of the distal fibula above the metaphysis. 3. Ankle mortise grossly intact. Electronically Signed   By: Genevive Bi M.D.   On: December 27, 2018 17:30    ROS no recent fever, chills, nausea, vomiting or changes in his appetite  Blood pressure 135/90, pulse 73, resp. rate 11, SpO2 93 %. Physical Exam  Well-nourished well-developed male in no apparent distress.  Alert and oriented x4.  Mood and affect are normal.  Extraocular motions are intact.  Respirations are unlabored.  Left lower extremity shows gross deformity of the ankle with moderate swelling.  Skin is intact.  No lymphadenopathy.  Dorsalis pedis pulses 2+.  Sensibility to light touch is intact dorsally and plantarly at the forefoot.  Active plantar flexion and dorsiflexion strength is evident at the toes.  Assessment/Plan Severely comminuted and displaced left distal tibia intra-articular fracture and left fibula lateral malleolus fracture -patient will be admitted and taken to the operating room this evening for closed reduction and application of an external fixator.  He will then require follow-up CT scan for further surgical planning.  He understands the risks and benefits of the alternative treatment options and elect surgical treatment.  He specifically understands risks of bleeding, infection, nerve damage, blood clots, posttraumatic arthritis, need for additional surgery, amputation and death.  Toni Arthurs, MD 27-Dec-2018, 9:47 PM

## 2018-12-02 ENCOUNTER — Encounter (HOSPITAL_COMMUNITY): Payer: Self-pay | Admitting: Orthopedic Surgery

## 2018-12-02 ENCOUNTER — Inpatient Hospital Stay (HOSPITAL_COMMUNITY): Payer: Self-pay

## 2018-12-02 MED ORDER — ONDANSETRON HCL 4 MG PO TABS: 4.0000 mg | ORAL_TABLET | Freq: Four times a day (QID) | ORAL | Status: DC | PRN

## 2018-12-02 MED ORDER — SENNA 8.6 MG PO TABS
1.0000 | ORAL_TABLET | Freq: Two times a day (BID) | ORAL | Status: DC
  Administered 2018-12-02 – 2018-12-04 (×6): 8.6 mg via ORAL
  Filled 2018-12-02 (×6): qty 1

## 2018-12-02 MED ORDER — ENOXAPARIN SODIUM 40 MG/0.4ML ~~LOC~~ SOLN
40.0000 mg | SUBCUTANEOUS | Status: DC
  Administered 2018-12-02 – 2018-12-04 (×3): 40 mg via SUBCUTANEOUS
  Filled 2018-12-02 (×3): qty 0.4

## 2018-12-02 MED ORDER — OXYCODONE HCL 5 MG PO TABS
5.0000 mg | ORAL_TABLET | ORAL | Status: DC | PRN
  Administered 2018-12-02: 15:00:00 5 mg via ORAL
  Filled 2018-12-02: qty 1

## 2018-12-02 MED ORDER — METHOCARBAMOL 500 MG PO TABS
500.0000 mg | ORAL_TABLET | Freq: Four times a day (QID) | ORAL | Status: DC | PRN
  Administered 2018-12-02 – 2018-12-03 (×3): 500 mg via ORAL
  Filled 2018-12-02 (×3): qty 1

## 2018-12-02 MED ORDER — ONDANSETRON HCL 4 MG/2ML IJ SOLN: 4.0000 mg | Freq: Four times a day (QID) | INTRAMUSCULAR | Status: DC | PRN

## 2018-12-02 MED ORDER — PHENOL 1.4 % MT LIQD
1.0000 | OROMUCOSAL | Status: DC | PRN
  Administered 2018-12-02: 05:00:00 1 via OROMUCOSAL
  Filled 2018-12-02: qty 177

## 2018-12-02 MED ORDER — HYDROMORPHONE HCL 1 MG/ML IJ SOLN
0.5000 mg | INTRAMUSCULAR | Status: DC | PRN
  Administered 2018-12-02 – 2018-12-03 (×4): 1 mg via INTRAVENOUS
  Filled 2018-12-02 (×4): qty 1

## 2018-12-02 MED ORDER — METHOCARBAMOL 1000 MG/10ML IJ SOLN
500.0000 mg | Freq: Four times a day (QID) | INTRAVENOUS | Status: DC | PRN
  Filled 2018-12-02: qty 5

## 2018-12-02 MED ORDER — DIPHENHYDRAMINE HCL 12.5 MG/5ML PO ELIX: 12.5000 mg | ORAL_SOLUTION | ORAL | Status: DC | PRN

## 2018-12-02 MED ORDER — PANTOPRAZOLE SODIUM 40 MG IV SOLR
40.0000 mg | Freq: Once | INTRAVENOUS | Status: AC
  Administered 2018-12-02: 01:00:00 40 mg via INTRAVENOUS

## 2018-12-02 MED ORDER — PANTOPRAZOLE SODIUM 40 MG IV SOLR: 40.0000 mg | Freq: Once | INTRAVENOUS | Status: DC

## 2018-12-02 MED ORDER — OXYCODONE HCL 5 MG PO TABS
10.0000 mg | ORAL_TABLET | ORAL | Status: DC | PRN
  Administered 2018-12-02 (×2): 15 mg via ORAL
  Administered 2018-12-03: 05:00:00 10 mg via ORAL
  Filled 2018-12-02 (×2): qty 3
  Filled 2018-12-02: qty 2

## 2018-12-02 MED ORDER — ACETAMINOPHEN 325 MG PO TABS
325.0000 mg | ORAL_TABLET | Freq: Four times a day (QID) | ORAL | Status: DC | PRN
  Administered 2018-12-02: 09:00:00 650 mg via ORAL
  Filled 2018-12-02: qty 2

## 2018-12-02 MED ORDER — MENTHOL 3 MG MT LOZG
1.0000 | LOZENGE | OROMUCOSAL | Status: DC | PRN
  Administered 2018-12-02: 05:00:00 3 mg via ORAL
  Filled 2018-12-02: qty 9

## 2018-12-02 MED ORDER — DOCUSATE SODIUM 100 MG PO CAPS
100.0000 mg | ORAL_CAPSULE | Freq: Two times a day (BID) | ORAL | Status: DC
  Administered 2018-12-02 – 2018-12-04 (×5): 100 mg via ORAL
  Filled 2018-12-02 (×6): qty 1

## 2018-12-02 MED ORDER — RACEPINEPHRINE HCL 2.25 % IN NEBU
INHALATION_SOLUTION | RESPIRATORY_TRACT | Status: AC
  Filled 2018-12-02: qty 0.5

## 2018-12-02 MED ORDER — SODIUM CHLORIDE 0.9 % IV SOLN
INTRAVENOUS | Status: DC
  Administered 2018-12-02: 01:00:00 via INTRAVENOUS

## 2018-12-02 MED ORDER — RACEPINEPHRINE HCL 2.25 % IN NEBU
0.5000 mL | INHALATION_SOLUTION | Freq: Once | RESPIRATORY_TRACT | Status: AC
  Administered 2018-12-02: 0.5 mL via RESPIRATORY_TRACT

## 2018-12-02 NOTE — Progress Notes (Signed)
Subjective: 1 Day Post-Op Procedure(s) (LRB): 1. Closed treatment of left distal tibia intra-articular fracture with manipulation under anesthesia 2. Closed treatment of left distal fibula fracture with manipulation under anesthesia 3. Application of multi plane external fixation to left lower extremity (Left)  Patient reports pain as mild to moderate.  Resting comfortably in bed.  Denies fever, chills, N/V, CP, SOB.  Reports that he is ready to eat a little breakfast.  Admits to flatus.  Objective:   VITALS:  Temp:  [97.5 F (36.4 C)-97.8 F (36.6 C)] 97.8 F (36.6 C) (10/08 0353) Pulse Rate:  [57-103] 66 (10/08 0353) Resp:  [11-22] 18 (10/08 0353) BP: (126-170)/(71-116) 126/71 (10/08 0353) SpO2:  [92 %-100 %] 97 % (10/08 0353)  General: WDWN patient in NAD. Psych:  Appropriate mood and affect. Neuro:  A&O x 3, Moving all extremities, sensation intact to light touch HEENT:  EOMs intact Chest:  Even non-labored respirations Skin: Ex-fix and dressing C/D/I, no rashes or lesions Extremities: warm/dry, mild edema, no erythema or echymosis.  No lymphadenopathy. Pulses: Popliteus 2+ MSK:  ROM: EHL/FHL intact, MMT: able to perform quad set    LABS Recent Labs    12/01/18 1631 12/01/18 1651  HGB 15.3 16.7  WBC 8.7  --   PLT 272  --    Recent Labs    12/01/18 1631 12/01/18 1651  NA 137 139  K 4.0 3.9  CL 100 101  CO2 24  --   BUN 13 15  CREATININE 1.16 1.10  GLUCOSE 132* 128*   Recent Labs    12/01/18 1631  INR 1.0     Assessment/Plan: 1 Day Post-Op Procedure(s) (LRB): 1. Closed treatment of left distal tibia intra-articular fracture with manipulation under anesthesia 2. Closed treatment of left distal fibula fracture with manipulation under anesthesia 3. Application of multi plane external fixation to left lower extremity (Left)  -Strict NWB LLE -Up with therapy -CT S/P application of ex-fix has been obtained.   -Patient will need to go to OR for removal of  ex-fix and ORIF L tibial pilon fx.  Dr. Doran Durand is working on planning and details.  Mechele Claude PA-C [facility] Office:  [phone]

## 2018-12-02 NOTE — Evaluation (Signed)
Physical Therapy Evaluation Patient Details Name: Kevin Powers MRN: [id number] DOB: 1960-09-27 Today's Date: 12/02/2018   History of Present Illness  Pt is a 58 y/o male admitted after falling off of a ladder in which he sustained a distal tibia fx, now s/p fixation with external fixator in place. No pertinent PMH.    Clinical Impression  Pt presented supine in bed with HOB elevated, awake and willing to participate in therapy session. Prior to admission, pt reported that he was independent with all functional mobility and ADLs. Pt lives with his wife in a single level home with several steps to enter. At the time of evaluation, pt requiring min guard for bed mobility, min A for transfers and min guard to ambulate within his room with RW. Pt able to maintain NWB L LE throughout independently. PT will continue to follow pt acutely to progress mobility as tolerated to ensure a safe d/c home. Will plan for stair training at next visit.    Follow Up Recommendations No PT follow up;Supervision for mobility/OOB    Equipment Recommendations  Rolling walker with 5" wheels;3in1 (PT)    Recommendations for Other Services       Precautions / Restrictions Precautions Precautions: Fall Precaution Comments: ex fix L LE Restrictions Weight Bearing Restrictions: Yes LLE Weight Bearing: Non weight bearing      Mobility  Bed Mobility Overal bed mobility: Needs Assistance Bed Mobility: Supine to Sit     Supine to sit: Min guard     General bed mobility comments: increased time, pt achieved sitting position towards his R side  Transfers Overall transfer level: Needs assistance Equipment used: Rolling walker (2 wheeled) Transfers: Sit to/from Stand Sit to Stand: Min assist         General transfer comment: PT provided instruction and demonstration for technique with use of RW; min A for stability with transitional movement  Ambulation/Gait Ambulation/Gait assistance: Min guard Gait  Distance (Feet): 30 Feet Assistive device: Rolling walker (2 wheeled) Gait Pattern/deviations: (hop-to on R LE with shoe donned) Gait velocity: decreased   General Gait Details: pt generally steady overall with RW; able to maintain NWB L LE throughout independently; no LOB or need for physical assistance, min guard for safety  Stairs            Wheelchair Mobility    Modified Rankin (Stroke Patients Only)       Balance Overall balance assessment: Needs assistance Sitting-balance support: No upper extremity supported Sitting balance-Leahy Scale: Good     Standing balance support: Bilateral upper extremity supported Standing balance-Leahy Scale: Poor                               Pertinent Vitals/Pain Pain Assessment: 0-10 Pain Score: 2  Pain Location: L ankle Pain Descriptors / Indicators: Sore Pain Intervention(s): Monitored during session;Repositioned    Home Living Family/patient expects to be discharged to:: Private residence Living Arrangements: Spouse/significant other Available Help at Discharge: Family;Friend(s);Available 24 hours/day Type of Home: House Home Access: Stairs to enter Entrance Stairs-Rails: Doctor, general practice of Steps: 6 Home Layout: One level Home Equipment: None      Prior Function Level of Independence: Independent               Hand Dominance        Extremity/Trunk Assessment   Upper Extremity Assessment Upper Extremity Assessment: Overall WFL for tasks assessed    Lower Extremity Assessment  Lower Extremity Assessment: LLE deficits/detail LLE Deficits / Details: pt able to maintain NWB throughout independently; pt with reported normal sensation of all toes; able to minimally flex/extend all toes LLE: Unable to fully assess due to immobilization    Cervical / Trunk Assessment Cervical / Trunk Assessment: Normal  Communication   Communication: No difficulties  Cognition  Arousal/Alertness: Awake/alert Behavior During Therapy: WFL for tasks assessed/performed Overall Cognitive Status: Within Functional Limits for tasks assessed                                        General Comments      Exercises     Assessment/Plan    PT Assessment Patient needs continued PT services  PT Problem List Decreased strength;Decreased range of motion;Decreased activity tolerance;Decreased balance;Decreased mobility;Decreased coordination;Decreased safety awareness;Decreased knowledge of use of DME;Decreased knowledge of precautions;Pain       PT Treatment Interventions DME instruction;Gait training;Stair training;Functional mobility training;Therapeutic activities;Therapeutic exercise;Balance training;Neuromuscular re-education;Patient/family education    PT Goals (Current goals can be found in the Care Plan section)  Acute Rehab PT Goals Patient Stated Goal: decrease pain PT Goal Formulation: With patient Time For Goal Achievement: 12/16/18 Potential to Achieve Goals: Good    Frequency Min 5X/week   Barriers to discharge        Co-evaluation               AM-PAC PT "6 Clicks" Mobility  Outcome Measure Help needed turning from your back to your side while in a flat bed without using bedrails?: None Help needed moving from lying on your back to sitting on the side of a flat bed without using bedrails?: A Little Help needed moving to and from a bed to a chair (including a wheelchair)?: A Little Help needed standing up from a chair using your arms (e.g., wheelchair or bedside chair)?: A Little Help needed to walk in hospital room?: None Help needed climbing 3-5 steps with a railing? : A Little 6 Click Score: 20    End of Session Equipment Utilized During Treatment: Gait belt Activity Tolerance: Patient tolerated treatment well Patient left: in chair;with call bell/phone within reach;with chair alarm set Nurse Communication: Mobility  status PT Visit Diagnosis: Other abnormalities of gait and mobility (R26.89);Pain Pain - Right/Left: Left Pain - part of body: Ankle and joints of foot    Time: 0258-5277 PT Time Calculation (min) (ACUTE ONLY): 19 min   Charges:   PT Evaluation $PT Eval Moderate Complexity: 1 Mod          Sherie Don, PT, DPT  Acute Rehabilitation Services Pager [phone] Office [place] 12/02/2018, 12:56 PM

## 2018-12-02 NOTE — Anesthesia Postprocedure Evaluation (Signed)
Anesthesia Post Note  Patient: Kevin Powers  Procedure(s) Performed: 1. Closed treatment of left distal tibia intra-articular fracture with manipulation under anesthesia 2. Closed treatment of left distal fibula fracture with manipulation under anesthesia 3. Application of multi plane external fixation to left lower extremity (Left Ankle)     Patient location during evaluation: PACU Anesthesia Type: General Level of consciousness: awake and alert Pain management: pain level controlled Vital Signs Assessment: post-procedure vital signs reviewed and stable Respiratory status: spontaneous breathing, nonlabored ventilation, respiratory function stable and patient connected to nasal cannula oxygen Cardiovascular status: blood pressure returned to baseline and stable Postop Assessment: no apparent nausea or vomiting Anesthetic complications: no Comments: Intermittent episodes of stridor in PACU. No acute episodes of desaturation. Albuterol Nebulizer and racemic epi administered in PACU. Normal mentation, pt endorses similar episodes at home. Recommended progressive care for close monitoring to surgeon after discharge from PACU.     Last Vitals:  Vitals:   12/01/18 2340 12/01/18 2345  BP:  (!) 156/114  Pulse: 98 94  Resp: (!) 22 20  Temp: 36.5 C   SpO2: 92% 94%    Last Pain:  Vitals:   12/01/18 2345  PainSc: [place]

## 2018-12-02 NOTE — Plan of Care (Signed)

## 2018-12-02 NOTE — Progress Notes (Signed)
Upon arrival to PACU patient having difficulty breathing.  Patient placed on non-rebreather.  Dr. Smith Robert notifed, to bedside.  Albuterol treatment given.  Upon auscultation, difficulties seem to be upper airway related, racemic epinepherine given with improvement.  Patient currently on 5L nasal cannula with sats of 100%.  Patient says he was recently diagnosed with severe reflux and given a prescription for it but he does not recall the name.  Patient describes the burning in his throat the same as when he has reflux.  Dr. Smith Robert notified and 40mg  IV Protonix ordered.  Will administer and continue to monitor.

## 2018-12-02 NOTE — Plan of Care (Signed)
  Problem: Education: Goal: Knowledge of General Education information will improve Description: Including pain rating scale, medication(s)/side effects and non-pharmacologic comfort measures Outcome: Progressing   Problem: Health Behavior/Discharge Planning: Goal: Ability to manage health-related needs will improve Outcome: Progressing   Problem: Clinical Measurements: Goal: Ability to maintain clinical measurements within normal limits will improve Outcome: Progressing Goal: Will remain free from infection Outcome: Progressing Goal: Respiratory complications will improve Outcome: Progressing Goal: Cardiovascular complication will be avoided Outcome: Progressing   Problem: Activity: Goal: Risk for activity intolerance will decrease Outcome: Progressing   Problem: Nutrition: Goal: Adequate nutrition will be maintained Outcome: Progressing   Problem: Coping: Goal: Level of anxiety will decrease Outcome: Progressing   Problem: Elimination: Goal: Will not experience complications related to bowel motility Outcome: Progressing Goal: Will not experience complications related to urinary retention Outcome: Progressing   

## 2018-12-03 ENCOUNTER — Encounter (HOSPITAL_COMMUNITY)
Admission: EM | Disposition: A | Discharge status: Home / Self Care | Payer: Self-pay | Source: Home / Self Care | Attending: Orthopedic Surgery

## 2018-12-03 ENCOUNTER — Encounter (HOSPITAL_COMMUNITY): Payer: Self-pay | Admitting: Anesthesiology

## 2018-12-03 ENCOUNTER — Inpatient Hospital Stay (HOSPITAL_COMMUNITY): Payer: Self-pay

## 2018-12-03 ENCOUNTER — Inpatient Hospital Stay (HOSPITAL_COMMUNITY): Payer: Self-pay | Admitting: Anesthesiology

## 2018-12-03 DIAGNOSIS — K219 Gastro-esophageal reflux disease without esophagitis: Secondary | ICD-10-CM | POA: Diagnosis present

## 2018-12-03 DIAGNOSIS — F17200 Nicotine dependence, unspecified, uncomplicated: Secondary | ICD-10-CM

## 2018-12-03 HISTORY — DX: Nicotine dependence, unspecified, uncomplicated: F17.200

## 2018-12-03 LAB — SURGICAL PCR SCREEN
MRSA, PCR: NEGATIVE
Staphylococcus aureus: NEGATIVE

## 2018-12-03 SURGERY — OPEN REDUCTION INTERNAL FIXATION (ORIF) TIBIA FRACTURE
Anesthesia: General | Laterality: Left

## 2018-12-03 MED ORDER — FENTANYL CITRATE (PF) 250 MCG/5ML IJ SOLN
INTRAMUSCULAR | Status: AC
  Filled 2018-12-03: qty 5

## 2018-12-03 MED ORDER — ACETAMINOPHEN 500 MG PO TABS
500.0000 mg | ORAL_TABLET | Freq: Two times a day (BID) | ORAL | Status: DC
  Administered 2018-12-03 – 2018-12-04 (×3): 500 mg via ORAL
  Filled 2018-12-03 (×4): qty 1

## 2018-12-03 MED ORDER — ENOXAPARIN SODIUM 40 MG/0.4ML ~~LOC~~ SOLN: 40.0000 mg | SUBCUTANEOUS | 0 refills | Status: DC

## 2018-12-03 MED ORDER — DOCUSATE SODIUM 100 MG PO CAPS: 100.0000 mg | ORAL_CAPSULE | Freq: Two times a day (BID) | ORAL | 0 refills | Status: DC

## 2018-12-03 MED ORDER — LIDOCAINE 2% (20 MG/ML) 5 ML SYRINGE
INTRAMUSCULAR | Status: AC
  Filled 2018-12-03: qty 5

## 2018-12-03 MED ORDER — ACETAMINOPHEN 500 MG PO TABS: 500.0000 mg | ORAL_TABLET | Freq: Two times a day (BID) | ORAL | 0 refills | Status: AC

## 2018-12-03 MED ORDER — EPHEDRINE 5 MG/ML INJ
INTRAVENOUS | Status: AC
  Filled 2018-12-03: qty 10

## 2018-12-03 MED ORDER — MIDAZOLAM HCL 2 MG/2ML IJ SOLN
INTRAMUSCULAR | Status: AC
  Filled 2018-12-03: qty 2

## 2018-12-03 MED ORDER — KETOROLAC TROMETHAMINE 15 MG/ML IJ SOLN
15.0000 mg | Freq: Three times a day (TID) | INTRAMUSCULAR | Status: DC
  Administered 2018-12-03 – 2018-12-04 (×3): 15 mg via INTRAVENOUS
  Filled 2018-12-03 (×4): qty 1

## 2018-12-03 MED ORDER — METHOCARBAMOL 500 MG PO TABS
750.0000 mg | ORAL_TABLET | Freq: Three times a day (TID) | ORAL | Status: DC
  Administered 2018-12-03 – 2018-12-04 (×4): 750 mg via ORAL
  Filled 2018-12-03 (×5): qty 2

## 2018-12-03 MED ORDER — OXYCODONE-ACETAMINOPHEN 7.5-325 MG PO TABS
1.0000 | ORAL_TABLET | Freq: Four times a day (QID) | ORAL | Status: DC | PRN
  Administered 2018-12-03 (×2): 1 via ORAL
  Filled 2018-12-03 (×2): qty 1

## 2018-12-03 MED ORDER — KETOROLAC TROMETHAMINE 10 MG PO TABS: 10.0000 mg | ORAL_TABLET | Freq: Three times a day (TID) | ORAL | 0 refills | Status: AC | PRN

## 2018-12-03 MED ORDER — METHOCARBAMOL 1000 MG/10ML IJ SOLN
500.0000 mg | Freq: Three times a day (TID) | INTRAVENOUS | Status: DC
  Filled 2018-12-03 (×6): qty 5

## 2018-12-03 MED ORDER — METHOCARBAMOL 750 MG PO TABS: 750.0000 mg | ORAL_TABLET | Freq: Three times a day (TID) | ORAL | 0 refills | Status: DC

## 2018-12-03 MED ORDER — OXYCODONE-ACETAMINOPHEN 7.5-325 MG PO TABS: 1.0000 | ORAL_TABLET | Freq: Four times a day (QID) | ORAL | 0 refills | Status: AC | PRN

## 2018-12-03 MED ORDER — PROPOFOL 10 MG/ML IV BOLUS
INTRAVENOUS | Status: AC
  Filled 2018-12-03: qty 20

## 2018-12-03 MED ORDER — PHENYLEPHRINE 40 MCG/ML (10ML) SYRINGE FOR IV PUSH (FOR BLOOD PRESSURE SUPPORT)
PREFILLED_SYRINGE | INTRAVENOUS | Status: AC
  Filled 2018-12-03: qty 10

## 2018-12-03 MED FILL — KETOROLAC 10 MG TABLET: 10 | 4 days supply | Qty: 12 | Fill #0

## 2018-12-03 MED FILL — METHOCARBAMOL 750 MG TABS: 750 | 20 days supply | Qty: 60 | Fill #0

## 2018-12-03 MED FILL — DOK 100 MG CAPS: 100 | 10 days supply | Qty: 20 | Fill #0

## 2018-12-03 MED FILL — ACETAMINOPHEN 500MG XT STRE: 500 | 30 days supply | Qty: 60 | Fill #0

## 2018-12-03 MED FILL — OXYCODON-ACETAMINOPHEN 7.5-: 7.5-325 | 7 days supply | Qty: 56 | Fill #0

## 2018-12-03 MED FILL — ENOXAPARIN SODIUM 40 MG/0.4: 40 | 12 days supply | Qty: 5 | Fill #0

## 2018-12-03 NOTE — Evaluation (Signed)
Occupational Therapy Evaluation Patient Details Name: Kevin Powers MRN: [id number] DOB: 09-May-1960 Today's Date: 12/03/2018    History of Present Illness Pt is a 58 y/o male admitted after falling off of a ladder in which he sustained a distal tibia fx, now s/p fixation with external fixator in place. No pertinent PMH.   Clinical Impression   Pt admitted with above. He demonstrates the below listed deficits and will benefit from continued OT to maximize safety and independence with BADLs.  Pt seen for fabrication of foot plate.  He was able to don/doff mod I, and was instructed in wear in care.  Will check back to ensure proper fit and his independence with managing it.        Follow Up Recommendations  No OT follow up    Equipment Recommendations  None recommended by OT    Recommendations for Other Services       Precautions / Restrictions Precautions Precautions: Fall Precaution Comments: ex fix L LE Restrictions Weight Bearing Restrictions: Yes LLE Weight Bearing: Non weight bearing      Mobility Bed Mobility                  Transfers                      Balance                                           ADL either performed or assessed with clinical judgement   ADL                                               Vision         Perception     Praxis      Pertinent Vitals/Pain Pain Assessment: 0-10 Pain Score: 7  Pain Location: L ankle Pain Descriptors / Indicators: Sore Pain Intervention(s): Monitored during session;Patient requesting pain meds-RN notified     Hand Dominance     Extremity/Trunk Assessment Upper Extremity Assessment Upper Extremity Assessment: Overall WFL for tasks assessed           Communication Communication Communication: No difficulties   Cognition Arousal/Alertness: Awake/alert Behavior During Therapy: WFL for tasks assessed/performed Overall Cognitive Status:  Within Functional Limits for tasks assessed                                     General Comments       Exercises Exercises: Other exercises Other Exercises Other Exercises: OT order received for Lt foot plate.  Foot plate was fabricated.  Pt was instructed on wear schedule, care, and monitoring for pressure.  He is able to don/doff foot plate - he did secure it too tightly and was instructed in correct positioning.  Written info was provided.     Shoulder Instructions      Home Living Family/patient expects to be discharged to:: Private residence Living Arrangements: Spouse/significant other Available Help at Discharge: Family;Friend(s);Available 24 hours/day Type of Home: House Home Access: Stairs to enter [employer] of Steps: 6 Entrance Stairs-Rails: Right;Left Home Layout: One level  Prior Functioning/Environment Level of Independence: Independent                 OT Problem List: Decreased range of motion;Decreased knowledge of precautions;Pain      OT Treatment/Interventions: Patient/family education    OT Goals(Current goals can be found in the care plan section) Acute Rehab OT Goals Patient Stated Goal: decrease pain OT Goal Formulation: With patient Time For Goal Achievement: 12/06/18 Potential to Achieve Goals: Good  OT Frequency: Min 2X/week   Barriers to D/C:            Co-evaluation              AM-PAC OT "6 Clicks" Daily Activity     Outcome Measure Help from another person eating meals?: None Help from another person taking care of personal grooming?: None Help from another person toileting, which includes using toliet, bedpan, or urinal?: A Little Help from another person bathing (including washing, rinsing, drying)?: A Little Help from another person to put on and taking off regular upper body clothing?: A Little Help from another person to put on and taking off regular lower  body clothing?: A Little 6 Click Score: 20   End of Session Nurse Communication: Other (comment)(foot plate )  Activity Tolerance: Patient tolerated treatment well Patient left: in bed;with call bell/phone within reach  OT Visit Diagnosis: Pain;Muscle weakness (generalized) (M62.81) Pain - Right/Left: Left Pain - part of body: Ankle and joints of foot                Time: 1121-1202 OT Time Calculation (min): 41 min Charges:  OT General Charges $OT Visit: 1 Visit OT Evaluation $OT Eval Moderate Complexity: 1 Mod OT Treatments $Self Care/Home Management : 8-22 mins $Orthotics Fit/Training: 8-22 mins  Jeani Hawking, OTR/L Acute Rehabilitation Services Pager [phone] Office [phone]   Jeani Hawking M 12/03/2018, 1:21 PM

## 2018-12-03 NOTE — Discharge Instructions (Addendum)
Orthopaedic Trauma Service Discharge Instructions   General Discharge Instructions  WEIGHT BEARING STATUS: Nonweightbearing Left leg   RANGE OF MOTION/ACTIVITY: ok to move knee and toes.  Keep Left leg elevated above heart as much as possible    Wound Care: see below   Discharge Pin Site Instructions  Continue to keep leg wrapped with ace wrap to help with swelling as well  Apply ice as much as possible to L ankle to help control swelling   Dress pins daily or every other day with Kerlix roll starting when you get home. Wrap the Kerlix so that it tamps the skin down around the pin-skin interface to prevent/limit motion of the skin relative to the pin.  (Pin-skin motion is the primary cause of pain and infection related to external fixator pin sites).  Remove any crust or coagulum that may obstruct drainage with a saline moistened gauze or soap and water.  After POD 3, if there is no discernable drainage on the pin site dressing, the interval for change can by increased to every other day.  You may shower with the fixator, cleaning all pin sites gently with soap and water.  If you have a surgical wound this needs to be completely dry and without drainage before showering.  The extremity can be lifted by the fixator to facilitate wound care and transfers.  Notify the office/Doctor if you experience increasing drainage, redness, or pain from a pin site, or if you notice purulent (thick, snot-like) drainage.    DVT/PE prophylaxis: Lovenox 40 mg subcutaneous injection daily x 4 weeks   Diet: as you were eating previously.  Can use over the counter stool softeners and bowel preparations, such as Miralax, to help with bowel movements.  Narcotics can be constipating.  Be sure to drink plenty of fluids  PAIN MEDICATION USE AND EXPECTATIONS  You have likely been given narcotic medications to help control your pain.  After a traumatic event that results in an fracture (broken bone) with  or without surgery, it is ok to use narcotic pain medications to help control one's pain.  We understand that everyone responds to pain differently and each individual patient will be evaluated on a regular basis for the continued need for narcotic medications. Ideally, narcotic medication use should last no more than 6-8 weeks (coinciding with fracture healing).   As a patient it is your responsibility as well to monitor narcotic medication use and report the amount and frequency you use these medications when you come to your office visit.   We would also advise that if you are using narcotic medications, you should take a dose prior to therapy to maximize you participation.  IF YOU ARE ON NARCOTIC MEDICATIONS IT IS NOT PERMISSIBLE TO OPERATE A MOTOR VEHICLE (MOTORCYCLE/CAR/TRUCK/MOPED) OR HEAVY MACHINERY DO NOT MIX NARCOTICS WITH OTHER CNS (CENTRAL NERVOUS SYSTEM) DEPRESSANTS SUCH AS ALCOHOL   STOP SMOKING OR USING NICOTINE PRODUCTS!!!!  As discussed nicotine severely impairs your body's ability to heal surgical and traumatic wounds but also impairs bone healing.  Wounds and bone heal by forming microscopic blood vessels (angiogenesis) and nicotine is a vasoconstrictor (essentially, shrinks blood vessels).  Therefore, if vasoconstriction occurs to these microscopic blood vessels they essentially disappear and are unable to deliver necessary nutrients to the healing tissue.  This is one modifiable factor that you can do to dramatically increase your chances of healing your injury.    (This means no smoking, no nicotine gum, patches, etc)  DO  NOT USE NONSTEROIDAL ANTI-INFLAMMATORY DRUGS (NSAID'S)  Using products such as Advil (ibuprofen), Aleve (naproxen), Motrin (ibuprofen) for additional pain control during fracture healing can delay and/or prevent the healing response.  If you would like to take over the counter (OTC) medication, Tylenol (acetaminophen) is ok.  However, some narcotic medications  that are given for pain control contain acetaminophen as well. Therefore, you should not exceed more than 4000 mg of tylenol in a day if you do not have liver disease.  Also note that there are may OTC medicines, such as cold medicines and allergy medicines that my contain tylenol as well.  If you have any questions about medications and/or interactions please ask your doctor/PA or your pharmacist.      ICE AND ELEVATE INJURED/OPERATIVE EXTREMITY  Using ice and elevating the injured extremity above your heart can help with swelling and pain control.  Icing in a pulsatile fashion, such as 20 minutes on and 20 minutes off, can be followed.    Do not place ice directly on skin. Make sure there is a barrier between to skin and the ice pack.    Using frozen items such as frozen peas works well as the conform nicely to the are that needs to be iced.  USE AN ACE WRAP OR TED HOSE FOR SWELLING CONTROL  In addition to icing and elevation, Ace wraps or TED hose are used to help limit and resolve swelling.  It is recommended to use Ace wraps or TED hose until you are informed to stop.    When using Ace Wraps start the wrapping distally (farthest away from the body) and wrap proximally (closer to the body)   Example: If you had surgery on your leg or thing and you do not have a splint on, start the ace wrap at the toes and work your way up to the thigh        If you had surgery on your upper extremity and do not have a splint on, start the ace wrap at your fingers and work your way up to the upper arm  IF YOU ARE IN A SPLINT OR CAST DO NOT [place]   If your splint gets wet for any reason please contact the office immediately. You may shower in your splint or cast as long as you keep it dry.  This can be done by wrapping in a cast cover or garbage back (or similar)  Do Not stick any thing down your splint or cast such as pencils, money, or hangers to try and scratch yourself with.  If you feel  itchy take benadryl as prescribed on the bottle for itching  IF YOU ARE IN A CAM BOOT (BLACK BOOT)  You may remove boot periodically. Perform daily dressing changes as noted below.  Wash the liner of the boot regularly and wear a sock when wearing the boot. It is recommended that you sleep in the boot until told otherwise    Call office for the following:  Temperature greater than 101F  Persistent nausea and vomiting  Severe uncontrolled pain  Redness, tenderness, or signs of infection (pain, swelling, redness, odor or green/yellow discharge around the site)  Difficulty breathing, headache or visual disturbances  Hives  Persistent dizziness or light-headedness  Extreme fatigue  Any other questions or concerns you may have after discharge  In an emergency, call 911 or go to an Emergency Department at a nearby hospital    CALL THE OFFICE  WITH ANY QUESTIONS OR CONCERNS: [phone]   VISIT OUR WEBSITE FOR ADDITIONAL INFORMATION: [email or web address]

## 2018-12-03 NOTE — Progress Notes (Signed)
Physical Therapy Treatment Patient Details Name: Kevin Powers MRN: [id number] DOB: [birth year] Today's Date: 12/03/2018    History of Present Illness Pt is a 58 y/o male admitted after falling off of a ladder in which he sustained a distal tibia fx, now s/p fixation with external fixator in place. No pertinent PMH.    PT Comments    Pt is progressing well towards PT goals. He ambulated in hallway with min guard assist and negotiated steps successfully. Pt is able to maintain NWB status w/o cueing. Expected to d/c tomorrow. Current plan remains appropriate.     Follow Up Recommendations  No PT follow up;Supervision for mobility/OOB     Equipment Recommendations  Rolling walker with 5" wheels;3in1 (PT)    Recommendations for Other Services       Precautions / Restrictions Precautions Precautions: Fall Precaution Comments: ex fix L LE Restrictions Weight Bearing Restrictions: Yes LLE Weight Bearing: Non weight bearing    Mobility  Bed Mobility Overal bed mobility: Needs Assistance Bed Mobility: Supine to Sit;Sit to Supine     Supine to sit: Min guard Sit to supine: Min guard   General bed mobility comments: increased time, pt achieved sitting position towards his R side  Transfers Overall transfer level: Needs assistance Equipment used: Rolling walker (2 wheeled) Transfers: Sit to/from Stand Sit to Stand: Min guard         General transfer comment: Pt standing w/o RW present and attemting to transfer to recliner chair near by that was not locked. Instructed pt to sit back down and educated on the importanct of standing with RW.  Ambulation/Gait Ambulation/Gait assistance: Min guard Gait Distance (Feet): 80 Feet Assistive device: Rolling walker (2 wheeled) Gait Pattern/deviations: (hop-to on R LE with shoe donned) Gait velocity: decreased   General Gait Details: pt generally steady overall with RW; able to maintain NWB L LE throughout independently; no LOB or need  for physical assistance, min guard for safety   Stairs Stairs: Yes Stairs assistance: Min guard Stair Management: Two rails;Forwards(hop to pattern) Number of Stairs: 6 General stair comments: Pt able to negotiate steps with min guard for safety. Cues given to keep LLE forward when descending to prevent fixator from catching on step. Cues for sequencing and technique.   Wheelchair Mobility    Modified Rankin (Stroke Patients Only)       Balance Overall balance assessment: Needs assistance Sitting-balance support: No upper extremity supported Sitting balance-Leahy Scale: Good     Standing balance support: Bilateral upper extremity supported Standing balance-Leahy Scale: Poor                              Cognition Arousal/Alertness: Awake/alert Behavior During Therapy: WFL for tasks assessed/performed Overall Cognitive Status: Within Functional Limits for tasks assessed                                 General Comments: slightly impulsive with standing      Exercises Other Exercises Other Exercises: OT order received for Lt foot plate.  Foot plate was fabricated.  Pt was instructed on wear schedule, care, and monitoring for pressure.  He is able to don/doff foot plate - he did secure it too tightly and was instructed in correct positioning.  Written info was provided.      General Comments        Pertinent Vitals/Pain Pain  Assessment: Faces Pain Score: 7  Faces Pain Scale: Hurts little more Pain Location: L ankle Pain Descriptors / Indicators: Sore Pain Intervention(s): Monitored during session;Limited activity within patient's tolerance    Home Living Family/patient expects to be discharged to:: Private residence Living Arrangements: Spouse/significant other Available Help at Discharge: Family;Friend(s);Available 24 hours/day Type of Home: House Home Access: Stairs to enter Entrance Stairs-Rails: Right;Left Home Layout: One level         Prior Function Level of Independence: Independent          PT Goals (current goals can now be found in the care plan section) Acute Rehab PT Goals Patient Stated Goal: decrease pain PT Goal Formulation: With patient Time For Goal Achievement: 12/16/18 Potential to Achieve Goals: Good Progress towards PT goals: Progressing toward goals    Frequency    Min 5X/week      PT Plan Current plan remains appropriate    Co-evaluation              AM-PAC PT "6 Clicks" Mobility   Outcome Measure  Help needed turning from your back to your side while in a flat bed without using bedrails?: None Help needed moving from lying on your back to sitting on the side of a flat bed without using bedrails?: A Little Help needed moving to and from a bed to a chair (including a wheelchair)?: A Little Help needed standing up from a chair using your arms (e.g., wheelchair or bedside chair)?: A Little Help needed to walk in hospital room?: None Help needed climbing 3-5 steps with a railing? : A Little 6 Click Score: 20    End of Session Equipment Utilized During Treatment: Gait belt Activity Tolerance: Patient tolerated treatment well Patient left: with call bell/phone within reach;in bed Nurse Communication: Mobility status PT Visit Diagnosis: Other abnormalities of gait and mobility (R26.89);Pain Pain - Right/Left: Left Pain - part of body: Ankle and joints of foot     Time: 0165-5374 PT Time Calculation (min) (ACUTE ONLY): 21 min  Charges:  $Gait Training: 8-22 mins                     Kevin Powers, [place] Pager [phone] Acute Rehab   Kevin Powers 12/03/2018, 3:22 PM

## 2018-12-03 NOTE — TOC Initial Note (Addendum)
Transition of Care [facility]) - Initial/Assessment Note    Patient Details  Name: Kevin Powers MRN: [id number] Date of Birth: 03/20/1960  [facility]) CM/SW Contact:    Gelene Mink, [place] Phone Number: 12/03/2018, 8:55 AM  Clinical Narrative:                  CSW is following patient's case. PT evaluated the patient a determined no follow up. The patient is going to be going back to the OR for another procedure.   CSW ordered DME through [employer], it will be charity since the patient has medicaid pending. CSW messaged the PA asking for DME orders to be placed.   Pt meds will be placed in the pharmacy and will need to be delivered to him before discharge.   CSW called and spoke with the patient about not having a PCP, he denied coordination of one. He felt that he could do it on his own. CSW stressed the importance of having one and following up. He stated that he would find one after discharge.    CSW will continue to follow.   Expected Discharge Plan: Home/Self Care Barriers to Discharge: Continued Medical Work up   Patient Goals and CMS Choice        Expected Discharge Plan and Services Expected Discharge Plan: Home/Self Care In-house Referral: Clinical Social Work   Post Acute Care Choice: NA Living arrangements for the past 2 months: Single Family Home                 DME Arranged: Walker rolling, 3-N-1 DME Agency: [employer] Date DME Agency Contacted: 12/03/18 Time DME Agency Contacted: [phone] Representative spoke with at DME Agency: Brad            Prior Living Arrangements/Services Living arrangements for the past 2 months: [place] with:: Spouse                   Activities of Daily Living      Permission Sought/Granted                  Emotional Assessment       Orientation: : Oriented to Self, Oriented to Place, Oriented to  Time, Oriented to Situation Alcohol / Substance Use: Not Applicable Psych Involvement:  No (comment)  Admission diagnosis:  Fall [W19.XXXA] Closed fracture of left ankle, initial encounter [S82.892A] Closed fracture of distal end of left fibula, unspecified fracture morphology, initial encounter [P37.902I] Patient Active Problem List   Diagnosis Date Noted  . Traumatic closed displaced fracture of distal end of tibia with fibula 12/01/2018   PCP:  Patient, No Pcp Per Pharmacy:  No Pharmacies Listed    Social Determinants of Health (SDOH) Interventions    Readmission Risk Interventions No flowsheet data found.

## 2018-12-03 NOTE — Discharge Summary (Signed)
Orthopaedic Trauma Service (OTS) Discharge Summary   Patient ID: Kevin Powers MRN: [id number] DOB/AGE: 09/09/1960 58 y.o.  Admit date: 12/01/2018 Discharge date: 12/04/2018  Admission Diagnoses: Closed complex intra-articular fracture left distal tibia and fibula Nicotine dependence GERD/hoarseness  Discharge Diagnoses:  Principal Problem:   Traumatic closed displaced fracture of distal end of tibia with fibula Active Problems:   Nicotine dependence   GERD (gastroesophageal reflux disease)   Hoarseness   Past Medical History:  Diagnosis Date   Nicotine dependence 12/03/2018     Procedures Performed: 12/01/2018-Dr. Victorino DikeHewitt  1.  Closed treatment of left distal tibia intra-articular fracture with manipulation under anesthesia                         2.  Closed treatment of left distal fibula fracture with manipulation under anesthesia                         3.  Application of multi plane external fixation to left lower extremity   Discharged Condition: good  Hospital Course:   Patient is a 58 year old male sustained a complex injury to his left ankle after falling from a ladder.  He was admitted to the hospital under the orthopedic service for his left distal tibia and fibula fractures.  He was taken to the OR on the day of presentation to the ED for application of a spanning external fixator.  We have hopeful to proceed with early definitive intervention however his swelling was too severe to allow for this to happen.  Patient worked with therapies during his hospital stay and was deemed to be stable for discharge on postoperative day #3.  He will follow-up with the orthopedic trauma service in 5 days for a recheck of his soft tissues determine when definitive fixation can take place.  He will remain on Lovenox for DVT PE prophylaxis.  He will continue to work on smoking cessation.  He will continue with aggressive ice and elevation to help with swelling control and  continuing his compressive garments as well.  Patient discharged in stable condition on 12/04/2018  We did have occupational therapy fabricate a footplate for him to help maintain his midfoot and forefoot in a neutral position while we await for soft tissue swelling solution.  This is supposed to prevent contracture.  We did arrange for Lovenox match program as patient is uninsured.    Consults: None  Significant Diagnostic Studies:   CT scan left ankle  Found a comminuted left intra-articular distal tibia fracture, comminuted left fibular fracture  Treatments: IV hydration, antibiotics: Ancef, analgesia: acetaminophen, Dilaudid and oxycodone, anticoagulation: LMW heparin, therapies: PT, OT and RN and surgery: As above  Discharge Exam:  Orthopaedic Trauma Progress Note  S: Doing okay this morning, moderate ankle pain but states it is tolerable. Denies numbness or tingling. Ready to go home today.   O:      Vitals:   12/03/18 1957 12/04/18 0420  BP: (!) 145/82 (!) 145/88  Pulse: 78 77  Resp: 19 18  Temp: 99.1 F (37.3 C) 98.3 F (36.8 C)  SpO2: 98% 99%    General - Sitting on couch in room, NAD. Pleasant and cooperative  Left Lower Extremity - Delta frame external fixator with footplate is intact to the left ankle. Forefoot and midfoot maintained in neutral position.  Ace wrap is in place. Foot swollen. Ecchymosis noted over lesser toes. No pain with passive  stretch. EHL, FHL and lesser toe motor functions are grossly intact. Sensation intact distally. Calf is soft. Extremity is warm, toes well perfused   Labs:  Lab Results Last 24 Hours       Results for orders placed or performed during the hospital encounter of 12/01/18 (from the past 24 hour(s))  Surgical pcr screen     Status: None   Collection Time: 12/03/18  8:18 AM   Specimen: Nasal Mucosa; Nasal Swab  Result Value Ref Range   MRSA, PCR NEGATIVE NEGATIVE   Staphylococcus aureus NEGATIVE NEGATIVE       Assessment: 58 year old black male fall from 15 foot ladder with complex left intra-articular distal tibia and fibula fractures  Patient too swollen to proceed with surgical fixation yesterday. Plan to be discharged today, will follow up with Dr. Carola Frost in OTS office on Wednesday for evaluation of soft tissue swelling. We reviewed the importance of maintaining his leg elevated above his heart as much as possible along with a compressive dressing and ice.I did review pin care and dressing care with the patient.Patient may shower with his ex-fix. He does need to remove the dressing before doing so and then apply a new one afterwards.    [name] [facility] ?[phone]? (phone)    Disposition: Discharge disposition: 01-Home or Self Care       Discharge Instructions    Call MD / Call 911   Complete by: As directed    If you experience chest pain or shortness of breath, CALL 911 and be transported to the hospital emergency room.  If you develope a fever above 101 F, pus (white drainage) or increased drainage or redness at the wound, or calf pain, call your surgeon's office.   Constipation Prevention   Complete by: As directed    Drink plenty of fluids.  Prune juice may be helpful.  You may use a stool softener, such as Colace (over the counter) 100 mg twice a day.  Use MiraLax (over the counter) for constipation as needed.   Diet general   Complete by: As directed    Discharge instructions   Complete by: As directed    Orthopaedic Trauma Service Discharge Instructions   General Discharge Instructions  WEIGHT BEARING STATUS: Nonweightbearing Left leg   RANGE OF MOTION/ACTIVITY: ok to move knee and toes.  Keep Left leg elevated above heart as much as possible    Wound Care: see below   Discharge Pin Site Instructions  Continue to keep leg wrapped with ace wrap to help with swelling as well  Apply ice as much as possible to L  ankle to help control swelling   Dress pins daily or every other day with Kerlix roll starting when you get home. Wrap the Kerlix so that it tamps the skin down around the pin-skin interface to prevent/limit motion of the skin relative to the pin.  (Pin-skin motion is the primary cause of pain and infection related to external fixator pin sites).  Remove any crust or coagulum that may obstruct drainage with a saline moistened gauze or soap and water.  After POD 3, if there is no discernable drainage on the pin site dressing, the interval for change can by increased to every other day.  You may shower with the fixator, cleaning all pin sites gently with soap and water.  If you have a surgical wound this needs to be completely dry and without drainage before showering.  The extremity can  be lifted by the fixator to facilitate wound care and transfers.  Notify the office/Doctor if you experience increasing drainage, redness, or pain from a pin site, or if you notice purulent (thick, snot-like) drainage.    DVT/PE prophylaxis: Lovenox 40 mg subcutaneous injection daily x 4 weeks   Diet: as you were eating previously.  Can use over the counter stool softeners and bowel preparations, such as Miralax, to help with bowel movements.  Narcotics can be constipating.  Be sure to drink plenty of fluids  PAIN MEDICATION USE AND EXPECTATIONS  You have likely been given narcotic medications to help control your pain.  After a traumatic event that results in an fracture (broken bone) with or without surgery, it is ok to use narcotic pain medications to help control one's pain.  We understand that everyone responds to pain differently and each individual patient will be evaluated on a regular basis for the continued need for narcotic medications. Ideally, narcotic medication use should last no more than 6-8 weeks (coinciding with fracture healing).   As a patient it is your responsibility as well to monitor  narcotic medication use and report the amount and frequency you use these medications when you come to your office visit.   We would also advise that if you are using narcotic medications, you should take a dose prior to therapy to maximize you participation.  IF YOU ARE ON NARCOTIC MEDICATIONS IT IS NOT PERMISSIBLE TO OPERATE A MOTOR VEHICLE (MOTORCYCLE/CAR/TRUCK/MOPED) OR HEAVY MACHINERY DO NOT MIX NARCOTICS WITH OTHER CNS (CENTRAL NERVOUS SYSTEM) DEPRESSANTS SUCH AS ALCOHOL   STOP SMOKING OR USING NICOTINE PRODUCTS!!!!  As discussed nicotine severely impairs your body's ability to heal surgical and traumatic wounds but also impairs bone healing.  Wounds and bone heal by forming microscopic blood vessels (angiogenesis) and nicotine is a vasoconstrictor (essentially, shrinks blood vessels).  Therefore, if vasoconstriction occurs to these microscopic blood vessels they essentially disappear and are unable to deliver necessary nutrients to the healing tissue.  This is one modifiable factor that you can do to dramatically increase your chances of healing your injury.    (This means no smoking, no nicotine gum, patches, etc)  DO NOT USE NONSTEROIDAL ANTI-INFLAMMATORY DRUGS (NSAID'S)  Using products such as Advil (ibuprofen), Aleve (naproxen), Motrin (ibuprofen) for additional pain control during fracture healing can delay and/or prevent the healing response.  If you would like to take over the counter (OTC) medication, Tylenol (acetaminophen) is ok.  However, some narcotic medications that are given for pain control contain acetaminophen as well. Therefore, you should not exceed more than 4000 mg of tylenol in a day if you do not have liver disease.  Also note that there are may OTC medicines, such as cold medicines and allergy medicines that my contain tylenol as well.  If you have any questions about medications and/or interactions please ask your doctor/PA or your pharmacist.      ICE AND ELEVATE  INJURED/OPERATIVE EXTREMITY  Using ice and elevating the injured extremity above your heart can help with swelling and pain control.  Icing in a pulsatile fashion, such as 20 minutes on and 20 minutes off, can be followed.    Do not place ice directly on skin. Make sure there is a barrier between to skin and the ice pack.    Using frozen items such as frozen peas works well as the conform nicely to the are that needs to be iced.  USE AN ACE WRAP OR TED  HOSE FOR SWELLING CONTROL  In addition to icing and elevation, Ace wraps or TED hose are used to help limit and resolve swelling.  It is recommended to use Ace wraps or TED hose until you are informed to stop.    When using Ace Wraps start the wrapping distally (farthest away from the body) and wrap proximally (closer to the body)   Example: If you had surgery on your leg or thing and you do not have a splint on, start the ace wrap at the toes and work your way up to the thigh        If you had surgery on your upper extremity and do not have a splint on, start the ace wrap at your fingers and work your way up to the upper arm  IF YOU ARE IN A SPLINT OR CAST DO NOT REMOVE IT FOR ANY REASON   If your splint gets wet for any reason please contact the office immediately. You may shower in your splint or cast as long as you keep it dry.  This can be done by wrapping in a cast cover or garbage back (or similar)  Do Not stick any thing down your splint or cast such as pencils, money, or hangers to try and scratch yourself with.  If you feel itchy take benadryl as prescribed on the bottle for itching  IF YOU ARE IN A CAM BOOT (BLACK BOOT)  You may remove boot periodically. Perform daily dressing changes as noted below.  Wash the liner of the boot regularly and wear a sock when wearing the boot. It is recommended that you sleep in the boot until told otherwise    Call office for the following: Temperature greater than 101F Persistent nausea and  vomiting Severe uncontrolled pain Redness, tenderness, or signs of infection (pain, swelling, redness, odor or green/yellow discharge around the site) Difficulty breathing, headache or visual disturbances Hives Persistent dizziness or light-headedness Extreme fatigue Any other questions or concerns you may have after discharge  In an emergency, call 911 or go to an Emergency Department at a nearby hospital    CALL THE OFFICE WITH ANY QUESTIONS OR CONCERNS: [phone]   VISIT OUR WEBSITE FOR ADDITIONAL INFORMATION: [email or web address]   Driving restrictions   Complete by: As directed    No driving   Increase activity slowly as tolerated   Complete by: As directed    Non weight bearing   Complete by: As directed    Laterality: left   Extremity: Lower     Allergies as of 12/04/2018   No Known Allergies     Medication List    TAKE these medications   acetaminophen 500 MG tablet Commonly known as: TYLENOL Take 1 tablet (500 mg total) by mouth every 12 (twelve) hours.   docusate sodium 100 MG capsule Commonly known as: COLACE Take 1 capsule (100 mg total) by mouth 2 (two) times daily.   enoxaparin 40 MG/0.4ML injection Commonly known as: LOVENOX Inject 0.4 mLs (40 mg total) into the skin daily.   famotidine 20 MG tablet Commonly known as: PEPCID Take 20 mg by mouth 2 (two) times daily.   ketorolac 10 MG tablet Commonly known as: TORADOL Take 1 tablet (10 mg total) by mouth every 8 (eight) hours as needed for up to 4 days for moderate pain.   methocarbamol 750 MG tablet Commonly known as: ROBAXIN Take 1 tablet (750 mg total) by mouth 3 (three) times daily.   oxyCODONE-acetaminophen 7.5-325 MG  tablet Commonly known as: PERCOCET Take 1-2 tablets by mouth every 6 (six) hours as needed for severe pain.            Durable Medical Equipment  (From admission, onward)         Start     Ordered   12/03/18 1119  For home use only DME Crutches  Once      12/03/18 1118   12/03/18 0911  For home use only DME Walker rolling  Once    Question:  Patient needs a walker to treat with the following condition  Answer:  Physical deconditioning   12/03/18 0910   12/03/18 0911  For home use only DME Bedside commode  Once    Question:  Patient needs a bedside commode to treat with the following condition  Answer:  Physical deconditioning   12/03/18 0910           Discharge Care Instructions  (From admission, onward)         Start     Ordered   12/03/18 0000  Non weight bearing    Question Answer Comment  Laterality left   Extremity Lower      12/03/18 1117         Follow-up Information    Altamese Lomita, MD Follow up on 12/08/2018.   Specialty: Orthopedic Surgery Contact information: [place] [address] [phone]           Discharge Instructions and Plan:  Patient sustained a significant injury to his left tibia and fibula.  He is been provisionally stabilized with external fixation but will require definitive surgery.  He has conventional soft tissue swelling present to allow for safe definitive intervention.  He will require week to 10 days of soft tissue rest to allow his swelling to subside enough for [place] to safely operate.  He will continue with aggressive ice and elevation of his left extremity.  Be mindful to elevate his leg above his heart as much as possible.  He is encouraged to move his toes as much as possible as well and will continue to use a compressive wrap to help with swelling.  He may begin daily dressing changes and pin care upon return to home. Patient will wear his footplate throughout the day and at night and he can remove it periodically for soft tissue relief and to perform range of motion exercises  He will remain nonweightbearing for now and for 8 weeks after definitive fixation  He will be on Lovenox for now and for 2 to 3 weeks after definitive surgery.  He is encouraged to  stop smoking as this will impact his swelling resolution as well as his ability to heal his fracture and soft tissue wounds.  We will check the patient back in 5 days for soft tissue check and at that point we will have a better sense will be able to proceed to the OR  Patient injury does put him at risk for development of posttraumatic arthritis and the need for subsequent procedures.  Signed:  Jari Pigg, PA-C [phone] (C) 12/04/2018, 1:12 PM  [facility] [place] [phone] (F)

## 2018-12-03 NOTE — Progress Notes (Addendum)
   12/03/18 1237  TOC Assessment  Discharge Planning Services [facility]   [place] information entered in [facility] for Lovenox; the pharmacy technician made aware. The Rx is locked up in the main pharmacy and will be provided prior to discharge.   Midge Minium RN, BSN, NCM-BC, ACM-RN [phone]

## 2018-12-03 NOTE — Progress Notes (Signed)
Provided teaching and demonstrated Lovenox administration. Pt stated understanding, questions answered.

## 2018-12-03 NOTE — Progress Notes (Signed)
Orthopedic Tech Progress Note Patient Details:  Kevin Powers [birth year] [id number]  Ortho Devices Ortho Device/Splint Location: wrap foot and ankle   Post Interventions Patient Tolerated: Well Instructions Provided: Care of device   [place] Pink 12/03/2018, 9:36 AM

## 2018-12-03 NOTE — Plan of Care (Signed)
  Problem: Education: Goal: Knowledge of General Education information will improve Description: Including pain rating scale, medication(s)/side effects and non-pharmacologic comfort measures Outcome: Progressing   Problem: Health Behavior/Discharge Planning: Goal: Ability to manage health-related needs will improve Outcome: Progressing   Problem: Clinical Measurements: Goal: Ability to maintain clinical measurements within normal limits will improve Outcome: Progressing   Problem: Activity: Goal: Risk for activity intolerance will decrease Outcome: Progressing   Problem: Coping: Goal: Level of anxiety will decrease Outcome: Progressing   Problem: Pain Managment: Goal: General experience of comfort will improve Outcome: Progressing   Problem: Safety: Goal: Ability to remain free from injury will improve Outcome: Progressing   

## 2018-12-03 NOTE — Plan of Care (Signed)

## 2018-12-03 NOTE — Progress Notes (Signed)
Left lateral- distal pin weeping blood- causing a spot in the bed sheets. Reinforced with abd and kerlix roll

## 2018-12-03 NOTE — Progress Notes (Signed)
RN spoke to patient's wife Ellard Artis, [phone] and gave update on patient's status. All questions were answered. Will continue to monitor patient.

## 2018-12-03 NOTE — Progress Notes (Addendum)
Orthopaedic Trauma Service (OTS) Consult   Patient ID: Kevin Powers MRN: [id number] DOB/AGE: [birth year] 58 y.o.   I saw and examined the patient with Mr. Kevin Powers, communicating the findings and plan noted below.  Myrene Galas, MD [facility], [facility] [phone]    Reason for Consult: complex Left pilon fracture (tibia and fibula) Referring Physician: Toni Arthurs, MD (ortho)   HPI: Kevin Powers is an 58 y.o. black male who sustained a complex fracture to his left distal tibia and fibula on 12/01/2018.  Patient was up on a ladder approximately 15 feet trying to change a window when he lost his balance and fell on the ground below.  He did land on grass.  Did not come to the skin.  He was unable to bear weight, had severe pain and deformity.  He was brought to [facility] where he was found to have isolated injury to his left distal tibia and fibula.  Orthopedics on-call was consulted.  He was taken emergently to the operating room for application of a spanning external fixator.  Repeat CT scan of his left ankle demonstrated a highly comminuted left intra-articular distal tibia fracture as well as left distal fibula fracture.  Due to the complexity of the injury the orthopedic trauma service was consulted for definitive management.  Patient seen and evaluated on the orthopedic floor this morning.  He complains of fairly moderate left ankle pain.  He denies any numbness or tingling.  No other pain elsewhere.  Denies any pain in his bilateral upper extremities or right lower extremity.  No chest pain, no head pain no abdominal pain.   Patient does smoke about half pack cigarettes a day  Does have GERD and takes over-the-counter medication  Denies allergies   Lives with his wife in a single-story house.  Single step to gain entry to the house   Patient is self-employed  History reviewed. No pertinent past medical history.  Past Surgical History:  Procedure  Laterality Date   EXTERNAL FIXATION LEG Left 12/01/2018   Procedure: 1. Closed treatment of left distal tibia intra-articular fracture with manipulation under anesthesia 2. Closed treatment of left distal fibula fracture with manipulation under anesthesia 3. Application of multi plane external fixation to left lower extremity;  Surgeon: Toni Arthurs, MD;  Location: [facility] OR;  Service: Orthopedics;  Laterality: Left;    History reviewed. No pertinent family history.  Social History:  has no history on file for tobacco, alcohol, and drug.  Allergies: Not on File  Medications: I have reviewed the patient's current medications. No outpatient medications have been marked as taking for the 12/01/18 encounter [facility] Encounter).     Results for orders placed or performed during the hospital encounter of 12/01/18 (from the past 48 hour(s))  CDS serology     Status: None   Collection Time: 12/01/18  4:31 PM  Result Value Ref Range   CDS serology specimen      SPECIMEN WILL BE HELD FOR 14 DAYS IF TESTING IS REQUIRED    Comment: SPECIMEN WILL BE HELD FOR 14 DAYS IF TESTING IS REQUIRED Performed at [facility], [address], [place], [place] [address]   Comprehensive metabolic panel     Status: Abnormal   Collection Time: 12/01/18  4:31 PM  Result Value Ref Range   Sodium 137 135 - 145 mmol/L   Potassium 4.0 3.5 - 5.1 mmol/L   Chloride 100 98 - 111 mmol/L  CO2 24 22 - 32 mmol/L   Glucose, Bld 132 (H) 70 - 99 mg/dL   BUN 13 6 - 20 mg/dL   Creatinine, Ser 1.61 0.61 - 1.24 mg/dL   Calcium 9.7 8.9 - 09.6 mg/dL   Total Protein 8.4 (H) 6.5 - 8.1 g/dL   Albumin 4.3 3.5 - 5.0 g/dL   AST 26 15 - 41 U/L   ALT 26 0 - 44 U/L   Alkaline Phosphatase 54 38 - 126 U/L   Total Bilirubin 0.7 0.3 - 1.2 mg/dL   GFR calc non Af Amer >60 >60 mL/min   GFR calc Af Amer >60 >60 mL/min   Anion gap 13 5 - 15    Comment: Performed at [facility], [address] [place], [place] [address]  CBC      Status: None   Collection Time: 12/01/18  4:31 PM  Result Value Ref Range   WBC 8.7 4.0 - 10.5 K/uL   RBC 5.10 4.22 - 5.81 MIL/uL   Hemoglobin 15.3 13.0 - 17.0 g/dL   HCT 98.1 19.1 - 47.8 %   MCV 89.4 80.0 - 100.0 fL   MCH 30.0 26.0 - 34.0 pg   MCHC 33.6 30.0 - 36.0 g/dL   RDW 29.5 62.1 - 30.8 %   Platelets 272 150 - 400 K/uL   nRBC 0.0 0.0 - 0.2 %    Comment: Performed at [facility], [address] [place], [place] [address]  Ethanol     Status: None   Collection Time: 12/01/18  4:31 PM  Result Value Ref Range   Alcohol, Ethyl (B) <10 <10 mg/dL    Comment: Performed at [facility], [address], [place], [place] [address]  Lactic acid, plasma     Status: None   Collection Time: 12/01/18  4:31 PM  Result Value Ref Range   Lactic Acid, Venous 1.3 0.5 - 1.9 mmol/L    Comment: Performed at [facility], [address] [place], [place] [address]  Protime-INR     Status: None   Collection Time: 12/01/18  4:31 PM  Result Value Ref Range   Prothrombin Time 12.7 11.4 - 15.2 seconds   INR 1.0 0.8 - 1.2    Comment: (NOTE) INR goal varies based on device and disease states. Performed at [facility], [address], [place], [place] [address]   Sample to Blood Bank     Status: None   Collection Time: 12/01/18  4:47 PM  Result Value Ref Range   Blood Bank Specimen SAMPLE AVAILABLE FOR TESTING    Sample Expiration      12/02/2018,2359 Performed at [facility], [address], [place], [place] [address]   I-stat chem 8, ED     Status: Abnormal   Collection Time: 12/01/18  4:51 PM  Result Value Ref Range   Sodium 139 135 - 145 mmol/L   Potassium 3.9 3.5 - 5.1 mmol/L   Chloride 101 98 - 111 mmol/L   BUN 15 6 - 20 mg/dL   Creatinine, Ser 5.36 0.61 - 1.24 mg/dL   Glucose, Bld 644 (H) 70 - 99 mg/dL   Calcium, Ion 0.34 7.42 - 1.40 mmol/L   TCO2 26 22 - 32 mmol/L   Hemoglobin 16.7 13.0 - 17.0 g/dL   HCT 59.5 63.8 - 75.6 %  Urinalysis, Routine w  reflex microscopic     Status: Abnormal   Collection Time:  12/01/18  7:45 PM  Result Value Ref Range   Color, Urine STRAW (A) YELLOW   APPearance CLEAR CLEAR   Specific Gravity, Urine >1.046 (H) 1.005 - 1.030   pH 5.0 5.0 - 8.0   Glucose, UA NEGATIVE NEGATIVE mg/dL   Hgb urine dipstick NEGATIVE NEGATIVE   Bilirubin Urine NEGATIVE NEGATIVE   Ketones, ur NEGATIVE NEGATIVE mg/dL   Protein, ur NEGATIVE NEGATIVE mg/dL   Nitrite NEGATIVE NEGATIVE   Leukocytes,Ua NEGATIVE NEGATIVE    Comment: Performed at [facility], [address], [place], [place] [address]  SARS Coronavirus 2 [facility] order, Performed in [facility] hospital lab) Nasopharyngeal Nasopharyngeal Swab     Status: None   Collection Time: 12/01/18  8:00 PM   Specimen: Nasopharyngeal Swab  Result Value Ref Range   SARS Coronavirus 2 NEGATIVE NEGATIVE    Comment: (NOTE) If result is NEGATIVE SARS-CoV-2 target nucleic acids are NOT DETECTED. The SARS-CoV-2 RNA is generally detectable in upper and lower  respiratory specimens during the acute phase of infection. The lowest  concentration of SARS-CoV-2 viral copies this assay can detect is 250  copies / mL. A negative result does not preclude SARS-CoV-2 infection  and should not be used as the sole basis for treatment or other  patient management decisions.  A negative result may occur with  improper specimen collection / handling, submission of specimen other  than nasopharyngeal swab, presence of viral mutation(s) within the  areas targeted by this assay, and inadequate number of viral copies  (<250 copies / mL). A negative result must be combined with clinical  observations, patient history, and epidemiological information. If result is POSITIVE SARS-CoV-2 target nucleic acids are DETECTED. The SARS-CoV-2 RNA is generally detectable in upper and lower  respiratory specimens dur ing the acute phase of infection.  Positive  results are indicative of active infection  with SARS-CoV-2.  Clinical  correlation with patient history and other diagnostic information is  necessary to determine patient infection status.  Positive results do  not rule out bacterial infection or co-infection with other viruses. If result is PRESUMPTIVE POSTIVE SARS-CoV-2 nucleic acids MAY BE PRESENT.   A presumptive positive result was obtained on the submitted specimen  and confirmed on repeat testing.  While 2019 novel coronavirus  (SARS-CoV-2) nucleic acids may be present in the submitted sample  additional confirmatory testing may be necessary for epidemiological  and / or clinical management purposes  to differentiate between  SARS-CoV-2 and other Sarbecovirus currently known to infect humans.  If clinically indicated additional testing with an alternate test  methodology [phone]) is advised. The SARS-CoV-2 RNA is generally  detectable in upper and lower respiratory sp ecimens during the acute  phase of infection. The expected result is Negative. Fact Sheet for Patients:  BoilerBrush.com.cy Fact Sheet for Healthcare Providers: https://pope.com/ This test is not yet approved or cleared by the [place] FDA and has been authorized for detection and/or diagnosis of SARS-CoV-2 by FDA under an Emergency Use Authorization (EUA).  This EUA will remain in effect (meaning this test can be used) for the duration of the COVID-19 declaration under Section 564(b)(1) of the Act, 21 U.S.C. section 360bbb-3(b)(1), unless the authorization is terminated or revoked sooner. Performed at [facility], [address] [place], [place] [address]     Dg Ankle 2 Views Left  Result Date: 12/01/2018 CLINICAL DATA:  Left external fixation EXAM: LEFT ANKLE - 2 VIEW; DG C-ARM 1-60 MIN COMPARISON:  None. FINDINGS: 5 intraop views of external fixation of the comminuted intra-articular distal tibial fracture and mildly displaced distal  fibular fracture. Overlying soft tissue swelling. Fluoro time 17 seconds IMPRESSION: Five intraop views of external fixation of distal tibia and fibular fractures. Electronically Signed   By: Jonna Clark M.D.   On: 12/01/2018 23:34   Ct Head Wo Contrast  Result Date: 12/01/2018 CLINICAL DATA:  Fall from ladder EXAM: CT HEAD WITHOUT CONTRAST TECHNIQUE: Contiguous axial images were obtained from the base of the skull through the vertex without intravenous contrast. COMPARISON:  None. FINDINGS: Brain: There is no mass, hemorrhage or extra-axial collection. The size and configuration of the ventricles and extra-axial CSF spaces are normal. The brain parenchyma is normal, without acute or chronic infarction. Vascular: No abnormal hyperdensity of the major intracranial arteries or dural venous sinuses. No intracranial atherosclerosis. Skull: The visualized skull base, calvarium and extracranial soft tissues are normal. Sinuses/Orbits: Moderate diffuse paranasal sinus disease. No mastoid effusion. The orbits are normal. IMPRESSION: 1. No acute intracranial abnormality. 2. Moderate diffuse paranasal sinus disease.  With Electronically Signed   By: Deatra Robinson M.D.   On: 12/01/2018 19:08   Ct Chest W Contrast  Result Date: 12/01/2018 CLINICAL DATA:  Trauma, 15 foot fall off ladder EXAM: CT ABDOMEN AND PELVIS WITH CONTRAST TECHNIQUE: Multidetector CT imaging of the abdomen and pelvis was performed using the standard protocol following bolus administration of intravenous contrast. CONTRAST:  OMNIPAQUE IOHEXOL 300 MG/ML  SOLN COMPARISON:  None. FINDINGS: Cardiovascular: Normal heart size. No significant pericardial fluid/thickening. Great vessels are normal in course and caliber. No evidence of acute thoracic aortic injury. No central pulmonary emboli. Mediastinum/Nodes: No pneumomediastinum. No mediastinal hematoma. Unremarkable esophagus. No axillary, mediastinal or hilar lymphadenopathy. Lungs/Pleura:Lungs  are clear No pneumothorax. No pleural effusion. Musculoskeletal: No fracture seen in the thorax. Abdomen/pelvis Hepatobiliary: Homogeneous hepatic attenuation without traumatic injury. No focal lesion. Gallbladder physiologically distended, no calcified stone. There are small calcifications a with seen within the distal CBD. No biliary dilatation. Pancreas: No evidence for traumatic injury. Portions are partially obscured by adjacent bowel loops and paucity of intra-abdominal fat. No ductal dilatation or inflammation. Spleen: Homogeneous attenuation without traumatic injury. Normal in size. Adrenals/Urinary Tract: No adrenal hemorrhage. Kidneys demonstrate symmetric enhancement and excretion on delayed phase imaging. No evidence or renal injury. Ureters are well opacified proximal through mid portion. Bladder is physiologically distended without wall thickening. Stomach/Bowel: Suboptimally assessed without enteric contrast, allowing for this, no evidence of bowel injury. Stomach physiologically distended. There are no dilated or thickened small or large bowel loops. Moderate stool burden. No evidence of mesenteric hematoma. No free air free fluid. Vascular/Lymphatic: No acute vascular injury. The abdominal aorta and IVC are intact. No evidence of retroperitoneal, abdominal, or pelvic adenopathy. Scattered aortic vascular calcifications are seen. Reproductive: No acute abnormality. Other: No focal contusion or abnormality of the abdominal wall. Musculoskeletal: No acute fracture of the lumbar spine or bony pelvis. IMPRESSION: 1.  No acute intrathoracic, abdominal, or pelvic injury. 2. Choledocholithiasis 3. Abdominal aortic atherosclerosis Electronically Signed   By: Jonna Clark M.D.   On: 12/01/2018 19:18   Ct Cervical Spine Wo Contrast  Result Date: 12/01/2018 CLINICAL DATA:  Fall from ladder EXAM: CT CERVICAL SPINE WITHOUT CONTRAST TECHNIQUE: Multidetector CT imaging of the cervical spine was performed  without intravenous contrast. Multiplanar CT image reconstructions were also generated. COMPARISON:  None. FINDINGS: Alignment: No static subluxation. Facets are aligned. Occipital condyles and the lateral masses  of C1 and C2 are normally approximated. Skull base and vertebrae: No acute fracture. Soft tissues and spinal canal: No prevertebral fluid or swelling. No visible canal hematoma. Disc levels: No advanced spinal canal or neural foraminal stenosis. Upper chest: No pneumothorax, pulmonary nodule or pleural effusion. Other: Normal visualized paraspinal cervical soft tissues. IMPRESSION: No acute fracture or static subluxation of the cervical spine. Electronically Signed   By: Deatra RobinsonKevin  Herman M.D.   On: 12/01/2018 19:19   Ct Abdomen Pelvis W Contrast  Result Date: 12/01/2018 CLINICAL DATA:  Trauma, 15 foot fall off ladder EXAM: CT ABDOMEN AND PELVIS WITH CONTRAST TECHNIQUE: Multidetector CT imaging of the abdomen and pelvis was performed using the standard protocol following bolus administration of intravenous contrast. CONTRAST:  100mL OMNIPAQUE IOHEXOL 300 MG/ML  SOLN COMPARISON:  None. FINDINGS: Cardiovascular: Normal heart size. No significant pericardial fluid/thickening. Great vessels are normal in course and caliber. No evidence of acute thoracic aortic injury. No central pulmonary emboli. Mediastinum/Nodes: No pneumomediastinum. No mediastinal hematoma. Unremarkable esophagus. No axillary, mediastinal or hilar lymphadenopathy. Lungs/Pleura:Lungs are clear No pneumothorax. No pleural effusion. Musculoskeletal: No fracture seen in the thorax. Abdomen/pelvis Hepatobiliary: Homogeneous hepatic attenuation without traumatic injury. No focal lesion. Gallbladder physiologically distended, no calcified stone. There are small calcifications a with seen within the distal CBD. No biliary dilatation. Pancreas: No evidence for traumatic injury. Portions are partially obscured by adjacent bowel loops and paucity of  intra-abdominal fat. No ductal dilatation or inflammation. Spleen: Homogeneous attenuation without traumatic injury. Normal in size. Adrenals/Urinary Tract: No adrenal hemorrhage. Kidneys demonstrate symmetric enhancement and excretion on delayed phase imaging. No evidence or renal injury. Ureters are well opacified proximal through mid portion. Bladder is physiologically distended without wall thickening. Stomach/Bowel: Suboptimally assessed without enteric contrast, allowing for this, no evidence of bowel injury. Stomach physiologically distended. There are no dilated or thickened small or large bowel loops. Moderate stool burden. No evidence of mesenteric hematoma. No free air free fluid. Vascular/Lymphatic: No acute vascular injury. The abdominal aorta and IVC are intact. No evidence of retroperitoneal, abdominal, or pelvic adenopathy. Scattered aortic vascular calcifications are seen. Reproductive: No acute abnormality. Other: No focal contusion or abnormality of the abdominal wall. Musculoskeletal: No acute fracture of the lumbar spine or bony pelvis. IMPRESSION: 1.  No acute intrathoracic, abdominal, or pelvic injury. 2. Choledocholithiasis 3. Abdominal aortic atherosclerosis Electronically Signed   By: Jonna ClarkBindu  Avutu M.D.   On: 12/01/2018 19:18   Dg Pelvis Portable  Result Date: 12/01/2018 CLINICAL DATA:  Fall from second story. EXAM: PORTABLE PELVIS 1-2 VIEWS COMPARISON:  None. FINDINGS: Frontal pelvis shows no fracture. No subluxation or dislocation. SI joints and symphysis pubis are unremarkable. Radiopaque debris overlies the right hemipelvis, presumably external to the patient. Foreign bodies also project over the proximal right thigh. IMPRESSION: Negative. Electronically Signed   By: Kennith CenterEric  Mansell M.D.   On: 12/01/2018 17:27   Ct Ankle Left Wo Contrast  Result Date: 12/02/2018 CLINICAL DATA:  Evaluate extent of left tibial pilon fracture EXAM: CT OF THE LEFT ANKLE WITHOUT CONTRAST TECHNIQUE:  Multidetector CT imaging of the left ankle was performed according to the standard protocol. Multiplanar CT image reconstructions were also generated. COMPARISON:  December 01, 2018 FINDINGS: Bones/Joint/Cartilage There is extensive comminuted fracture of the distal tibia with extensive intra-articular extension and impaction. There is slight displacement and impaction of the tibial shaft on the of the fracture fragment seen along the posteromedial border. There is the medial malleolus still appears to be intact.  There is a comminuted impacted distal fibular fracture. The lateral malleolus, however appears to be intact. No other fracture is identified. A small amount of subcutaneous emphysema seen within the anterior tibiotalar joint. Ligaments Suboptimally assessed by CT. Muscles and Tendons The muscles surrounding the ankle appear to be intact. The flexor and extensor tendons appear to be grossly intact. The iliopsoas tendon is intact. Soft tissues There is diffuse soft tissue edema seen around the ankle. A small ankle joint effusion is seen. IMPRESSION: 1. Findings of a type 3 distal tibial pilon fracture. 2. Comminuted impacted distal fibular fracture. Electronically Signed   By: Prudencio Pair M.D.   On: 12/02/2018 03:56   Ct Ankle Left Wo Contrast  Result Date: 12/01/2018 CLINICAL DATA:  Ankle trauma with fracture and dislocation. Status post fall from a ladder. EXAM: CT OF THE LEFT ANKLE WITHOUT CONTRAST TECHNIQUE: Multidetector CT imaging of the left ankle was performed according to the standard protocol. Multiplanar CT image reconstructions were also generated. COMPARISON:  None. FINDINGS: Bones/Joint/Cartilage Acute severely comminuted fracture of the distal tibial metaphysis and epiphysis involving the articular surface of the tibial plafond with 7 mm of lateral displacement and apex medial angulation. Major anterior fracture fragment is anteriorly displaced by 11 mm. Minimally displaced medial malleolar  fracture. Nondisplaced posterior malleolar fracture. Comminuted fracture of the distal fibular diaphysis with 1 shaft with lateral displacement. Mild apex medial angulation. No dislocation. No other acute fracture.  No joint effusion. Ligaments Ligaments are suboptimally evaluated by CT. Muscles and Tendons Muscles are normal. No muscle atrophy. Posterior tibial tendon and flexor digitorum tendons coursing medially adjacent to the distal tibial fracture with possible entrapment within the fracture cleft. Intact Achilles tendon. Soft tissue No fluid collection or hematoma.  No soft tissue mass. IMPRESSION: Acute severely comminuted fracture of the distal tibial metaphysis and epiphysis involving the articular surface of the tibial plafond with 7 mm of lateral displacement and apex medial angulation. Major anterior fracture fragment is anteriorly displaced by 11 mm. Minimally displaced medial malleolar fracture. Nondisplaced posterior malleolar fracture. Posterior tibial tendon and flexor digitorum tendons coursing medially adjacent to the distal tibial fracture with possible entrapment within the fracture cleft. Comminuted fracture of the distal fibular diaphysis with 1 shaft with lateral displacement. Mild apex medial angulation. Electronically Signed   By: Kathreen Devoid   On: 12/01/2018 19:10   Ct L-spine No Charge  Result Date: 12/01/2018 CLINICAL DATA:  Fall from ladder fall from ladder EXAM: CT LUMBAR SPINE WITHOUT CONTRAST TECHNIQUE: Multidetector CT imaging of the lumbar spine was performed without intravenous contrast administration. Multiplanar CT image reconstructions were also generated. COMPARISON:  None. FINDINGS: Segmentation: Normal Alignment: Normal Vertebrae: No fracture Paraspinal and other soft tissues: Please see dedicated CT abdomen pelvis Disc levels: No large disc herniation. No spinal canal stenosis. The neural foramina are patent. IMPRESSION: No acute abnormality of the lumbar spine.  Electronically Signed   By: Ulyses Jarred M.D.   On: 12/01/2018 19:05   Dg Chest Port 1 View  Result Date: 12/01/2018 CLINICAL DATA:  Fall from second story. EXAM: PORTABLE CHEST 1 VIEW COMPARISON:  None. FINDINGS: 1646 hours. The lungs are clear without focal pneumonia, edema, pneumothorax or pleural effusion. The cardiopericardial silhouette is within normal limits for size. The visualized bony structures of the thorax are intact. Telemetry leads overlie the chest. IMPRESSION: No active disease. Electronically Signed   By: Misty Stanley M.D.   On: 12/01/2018 17:25   Dg Tibia/fibula Left Port  Result Date: 12/01/2018 CLINICAL DATA:  Ankle fracture EXAM: PORTABLE LEFT TIBIA AND FIBULA - 2 VIEW COMPARISON:  None. FINDINGS: No fracture the proximal tibia or fibula. Complex fracture of the distal tibia fibula about the ankle joint. IMPRESSION: Complex distal tibia and fibular fractures.  No proximal fracture Electronically Signed   By: Genevive Bi M.D.   On: 12/01/2018 17:40   Dg Ankle Left Port  Result Date: 12/01/2018 CLINICAL DATA:  Patient fell from second story ladder today, pain and deformity to left ankle. EXAM: PORTABLE LEFT ANKLE - 2 VIEW COMPARISON:  None. FINDINGS: Fracture of the distal tibial metaphysis with anterior angulation. Fracture plane enters the tibial talar articular surface. Comminuted fracture fragments about the fracture plane. Comminuted fracture of the fibula just above the metaphysis. The ankle mortise grossly intact. No calcaneus fracture identified. IMPRESSION: 1. Complex comminuted intra-articular fracture of the tibial metaphysis with angulation and compaction. 2. Comminuted fracture of the distal fibula above the metaphysis. 3. Ankle mortise grossly intact. Electronically Signed   By: Genevive Bi M.D.   On: 12/01/2018 17:30   Dg C-arm 1-60 Min  Result Date: 12/01/2018 CLINICAL DATA:  Left external fixation EXAM: LEFT ANKLE - 2 VIEW; DG C-ARM 1-60 MIN  COMPARISON:  None. FINDINGS: 5 intraop views of external fixation of the comminuted intra-articular distal tibial fracture and mildly displaced distal fibular fracture. Overlying soft tissue swelling. Fluoro time 17 seconds IMPRESSION: Five intraop views of external fixation of distal tibia and fibular fractures. Electronically Signed   By: Jonna Clark M.D.   On: 12/01/2018 23:34    Review of Systems  Constitutional: Negative for chills and fever.  Respiratory: Negative for shortness of breath and wheezing.   Cardiovascular: Negative for chest pain and palpitations.  Gastrointestinal: Negative for nausea and vomiting.  Musculoskeletal:       Left ankle pain  Neurological: Negative for tingling and sensory change.   Blood pressure (!) 169/92, pulse 70, temperature 97.9 F (36.6 C), temperature source Oral, resp. rate 20, SpO2 96 %. Physical Exam Vitals signs and nursing note reviewed.  Constitutional:      General: He is not in acute distress.    Appearance: Normal appearance.     Comments: Very pleasant male, no acute distress appears appropriate for age  HENT:     Head: Normocephalic and atraumatic.     Mouth/Throat:     Mouth: Mucous membranes are moist.  Eyes:     Extraocular Movements: Extraocular movements intact.  Neck:     Musculoskeletal: Full passive range of motion without pain and normal range of motion. No neck rigidity or muscular tenderness.  Cardiovascular:     Rate and Rhythm: Normal rate and regular rhythm.  Pulmonary:     Effort: Pulmonary effort is normal. No accessory muscle usage or respiratory distress.  Abdominal:     General: There is no distension.     Palpations: Abdomen is soft.     Tenderness: There is no abdominal tenderness.  Musculoskeletal:     Comments: Pelvis--no traumatic wounds or rash, no ecchymosis, stable to manual stress, nontender   Left lower extremity Delta frame external fixator is intact to the left ankle Ace wrap is in  place Drainage over the calcaneal pin dressings Ace wrap was removed to assess swelling Extensive swelling to the left ankle both anteriorly and laterally Skin does not wrinkle with gentle compression No fracture blisters identified at this time ?  Small area of skin pressure injury over  the anterior aspect of the ankle Extremity is warm + DP pulse EHL, FHL and lesser toe motor functions are grossly intact DPN, SPN, TN sensory functions are intact No pain with passive stretching Calf is soft Foot is swollen but again no pain with passive stretching He does have some ecchymosis noted to his lesser toes  Knee and hip are nontender No pain on palpation of his knee or hip.  No pain with axial loading or logrolling of his left hip  Right lower extremity             no open wounds or lesions, no swelling or ecchymosis   Nontender hip, knee, ankle and foot             No crepitus or gross motion noted with manipulation of the right leg  No knee or ankle effusion             No pain with axial loading or logrolling of the hip. Negative Stinchfield test   Knee stable to varus/ valgus and anterior/posterior stress             No pain with manipulation of the ankle or foot             No blocks to motion noted  Sens DPN, SPN, TN intact  Motor EHL, FHL, lesser toe motor, Ext, flex, evers 5/5  DP 2+, PT 2+, No significant edema             Compartments are soft and nontender, no pain with passive stretching  B upper extremities shoulder, elbow, wrist, digits- no skin wounds, nontender, no instability, no blocks to motion  Sens  Ax/R/M/U intact  Mot   Ax/ R/ PIN/ M/ AIN/ U intact  Rad 2+     Skin:    General: Skin is warm.     Capillary Refill: Capillary refill takes less than 2 seconds.  Neurological:     General: No focal deficit present.     Mental Status: He is alert and oriented to person, place, and time.     Comments: Did not assess coordination and gait due to left distal  tibia fracture  Psychiatric:        Mood and Affect: Mood normal.        Behavior: Behavior normal. Behavior is cooperative.        Thought Content: Thought content normal.        Judgment: Judgment normal.     Assessment/Plan:  58 year old black male fall from 15 feet with complex left distal tibia and fibula fractures  -Fall from ladder  -Complex left intra-articular distal tibia and fibula fracture s/p external fixation on 12/01/2018  At this time patient has too much soft tissue swelling to allow for safe definitive surgical intervention.  I anticipate that he will require at least a 7 to 10 days of aggressive soft tissue rest to allow for swelling resolution for definitive fixation  We reviewed the importance of maintaining his leg elevated above his heart as much as possible along with a compressive dressing and ice.  His pin site dressings were changed and a new bulky compressive dressing was applied.   I did review pin care and dressing care with the patient   He can get in the shower and shower with his ex-fix.  He does need to remove the dressing before doing so and then apply a new one afterwards.   He will remain nonweightbearing for now and for  8 weeks post fixation   We will have occupational therapy fabricate a footplate to help maintain his forefoot and midfoot and a neutral position   We will see him back in the office in 5 days to reassess his soft tissue, possibly OR at the end of next week but likely the beginning of the following week   We discussed the importance of stopping all nicotine products as well to help his soft tissue healing and eventually his bone healing   Patient has quit in the past.  Has quit for a year   He is agreeable to a trial of pharmacologic assistance for smoking cessation.  We will place him on Wellbutrin XR for this   - Pain management:  Schedule Tylenol  Percocet 7.5/325 1-2 every 6 hours as needed  Ketorolac 10 mg po q8h x 4  days  Robaxin 700 mg every 8 hours   Ice and elevation  - ABL anemia/Hemodynamics  Stable  - Medical issues   GERD/Hoarsness    Looks like he has been referred to ENT by PCP and was started on pepcid    - DVT/PE prophylaxis:  Lovenox x 4 weeks   Match program  - Metabolic Bone Disease:  Check vitamin d  - Activity:  NWB L leg  Aggressive elevation of left leg above heart  - FEN/GI prophylaxis/Foley/Lines:  Regular diet  KVO   -Ex-fix/Splint care:  Okay to manipulate leg by fixator  Okay to shower and fixator  - Impediments to fracture healing:  Complex/high-energy injury  Intra-articular injury  Nicotine dependence  - Dispo:  Footplate  Mobilize with therapies  DME and medication  Discharge home tomorrow    Mearl Latin, PA-C [phone] (C) 12/03/2018, 9:54 AM  [facility] [address] [place] [address] [phone] (F)

## 2018-12-04 LAB — VITAMIN D 25 HYDROXY (VIT D DEFICIENCY, FRACTURES): Vit D, 25-Hydroxy: 22.77 ng/mL — ABNORMAL LOW (ref 30–100)

## 2018-12-04 MED ORDER — PNEUMOCOCCAL VAC POLYVALENT 25 MCG/0.5ML IJ INJ
0.5000 mL | INJECTION | INTRAMUSCULAR | Status: AC
  Administered 2018-12-04: 12:00:00 0.5 mL via INTRAMUSCULAR
  Filled 2018-12-04: qty 0.5

## 2018-12-04 NOTE — Progress Notes (Signed)
Discharge instructions provided.  Pt and sig other verbalize understanding of all instructions and follow-up care.  Medications given to pt from pharmacy. Pt discharged to home at 1345 on 12/04/18 va wheelchair by NT.

## 2018-12-04 NOTE — Progress Notes (Signed)
Orthopedic Tech Progress Note Patient Details:  Dorse Locy 11-27-1960 [id number]  Ortho Devices Type of Ortho Device: Crutches Ortho Device/Splint Location: wrap foot and ankle Ortho Device/Splint Interventions: Adjustment   Post Interventions Patient Tolerated: Ambulated well Instructions Provided: Poper ambulation with device, Care of device, Adjustment of device   [name] 12/04/2018, 12:10 PM

## 2018-12-04 NOTE — Progress Notes (Signed)
Occupational Therapy Treatment Patient Details Name: Hafiz Irion MRN: [id number] DOB: [birth year] Today's Date: 12/04/2018    History of present illness Pt is a 58 y/o male admitted after falling off of a ladder in which he sustained a distal tibia fx, now s/p fixation with external fixator in place. No pertinent PMH.   OT comments  Pt reports foot plate is fitting well, with no evidence of pressure.  He is able to instruct me how to don/doff, and requires min A to do so.  Reinforced wear schedule and need to monitor for pressure.    Follow Up Recommendations  No OT follow up    Equipment Recommendations  None recommended by OT    Recommendations for Other Services      Precautions / Restrictions Precautions Precautions: Fall Precaution Comments: ex fix L LE Restrictions Weight Bearing Restrictions: Yes LLE Weight Bearing: Non weight bearing       Mobility Bed Mobility                  Transfers                      Balance                                           ADL either performed or assessed with clinical judgement   ADL                                               Vision       Perception     Praxis      Cognition Arousal/Alertness: Awake/alert Behavior During Therapy: WFL for tasks assessed/performed Overall Cognitive Status: Within Functional Limits for tasks assessed                                          Exercises Other Exercises Other Exercises: Pt reports the foot plate "feels great".  He was able to don/doff with min A, and able to instruct me in how to don.  reinforced wear schedule, how to clean and maintain, and to monitor for pressure.  He verbalized understanding    Shoulder Instructions       General Comments      Pertinent Vitals/ Pain       Pain Assessment: Faces Faces Pain Scale: Hurts little more Pain Location: L ankle Pain Descriptors / Indicators:  Sore Pain Intervention(s): Monitored during session  Home Living                                          Prior Functioning/Environment              Frequency  Min 2X/week        Progress Toward Goals  OT Goals(current goals can now be found in the care plan section)  Progress towards OT goals: Goals met/education completed, patient discharged from [place] Discharge plan remains appropriate    Co-evaluation  AM-PAC OT "6 Clicks" Daily Activity     Outcome Measure   Help from another person eating meals?: None Help from another person taking care of personal grooming?: None Help from another person toileting, which includes using toliet, bedpan, or urinal?: A Little Help from another person bathing (including washing, rinsing, drying)?: A Little Help from another person to put on and taking off regular upper body clothing?: A Little Help from another person to put on and taking off regular lower body clothing?: A Little 6 Click Score: 20    End of Session    OT Visit Diagnosis: Pain;Muscle weakness (generalized) (M62.81) Pain - Right/Left: Left Pain - part of body: Ankle and joints of foot   Activity Tolerance Patient tolerated treatment well   Patient Left in bed;with call bell/phone within reach   Nurse Communication          Time: 8138-8719 OT Time Calculation (min): 11 min  Charges: OT General Charges $OT Visit: 1 Visit OT Treatments $Self Care/Home Management : 8-22 mins  Lucille Passy, OTR/L [place] Pager [phone] Office [phone]    Lucille Passy M 12/04/2018, 10:54 AM

## 2018-12-04 NOTE — Progress Notes (Signed)
Orthopaedic Trauma Progress Note  S: Doing okay this morning, moderate ankle pain but states it is tolerable. Denies numbness or tingling. Ready to go home today.   O:  Vitals:   12/03/18 1957 12/04/18 0420  BP: (!) 145/82 (!) 145/88  Pulse: 78 77  Resp: 19 18  Temp: 99.1 F (37.3 C) 98.3 F (36.8 C)  SpO2: 98% 99%    General - Sitting on couch in room, NAD. Pleasant and cooperative  Left Lower Extremity - Delta frame external fixator with footplate is intact to the left ankle. Forefoot and midfoot maintained in neutral position.  Ace wrap is in place. Foot swollen. Ecchymosis noted over lesser toes. No pain with passive stretch. EHL, FHL and lesser toe motor functions are grossly intact. Sensation intact distally. Calf is soft. Extremity is warm, toes well perfused   Labs:  Results for orders placed or performed during the hospital encounter of 12/01/18 (from the past 24 hour(s))  Surgical pcr screen     Status: None   Collection Time: 12/03/18  8:18 AM   Specimen: Nasal Mucosa; Nasal Swab  Result Value Ref Range   MRSA, PCR NEGATIVE NEGATIVE   Staphylococcus aureus NEGATIVE NEGATIVE    Assessment: 58 year old black male fall from 15 foot ladder with complex left intra-articular distal tibia and fibula fractures  Patient too swollen to proceed with surgical fixation yesterday. Plan to be discharged today, will follow up with Dr. Marcelino Scot in [place] office on Wednesday for evaluation of soft tissue swelling.  We reviewed the importance of maintaining his leg elevated above his heart as much as possible along with a compressive dressing and ice. I did review pin care and dressing care with the patient. Patient may shower with his ex-fix.  He does need to remove the dressing before doing so and then apply a new one afterwards.    Rachael Zapanta A. Carmie Kanner [facility] ?[phone]? (phone)

## 2018-12-06 LAB — CALCITRIOL (1,25 DI-OH VIT D): Vit D, 1,25-Dihydroxy: 45.4 pg/mL (ref 19.9–79.3)

## 2019-01-06 ENCOUNTER — Encounter (HOSPITAL_COMMUNITY): Payer: Self-pay

## 2019-01-06 ENCOUNTER — Other Ambulatory Visit (HOSPITAL_COMMUNITY)
Admission: RE | Admit: 2019-01-06 | Discharge: 2019-01-06 | Disposition: A | Discharge status: Home / Self Care | Payer: Self-pay | Source: Ambulatory Visit | Attending: Orthopedic Surgery | Admitting: Orthopedic Surgery

## 2019-01-06 DIAGNOSIS — Z01812 Encounter for preprocedural laboratory examination: Secondary | ICD-10-CM | POA: Insufficient documentation

## 2019-01-06 DIAGNOSIS — Z20828 Contact with and (suspected) exposure to other viral communicable diseases: Secondary | ICD-10-CM | POA: Insufficient documentation

## 2019-01-06 LAB — SARS CORONAVIRUS 2 (TAT 6-24 HRS): SARS Coronavirus 2: NEGATIVE

## 2019-01-06 NOTE — Progress Notes (Signed)
Pt. 's voice very husky/wraspy- he states that is  normal for him. Pt. Will be tested for COVID this afternoon.  Reviewed instructions for surgery sch. For 01/07/2019

## 2019-01-06 NOTE — Progress Notes (Signed)
Gave info. To woman answering phone, made aware that surgery is tomorrow ; she will pass along the need for preop orders to [employer]

## 2019-01-06 NOTE — Anesthesia Preprocedure Evaluation (Addendum)
Anesthesia Evaluation  Patient identified by MRN, date of birth, ID band Patient awake    Reviewed: Allergy & Precautions, NPO status , Patient's Chart, lab work & pertinent test results  History of Anesthesia Complications Negative for: history of anesthetic complications  Airway Mallampati: II  TM Distance: >3 FB Neck ROM: Full    Dental no notable dental hx.    Pulmonary Current Smoker and Patient abstained from smoking.,    Pulmonary exam normal        Cardiovascular negative cardio ROS Normal cardiovascular exam     Neuro/Psych negative neurological ROS  negative psych ROS   GI/Hepatic GERD  Controlled,(+)     substance abuse  marijuana use,   Endo/Other  negative endocrine ROS  Renal/GU negative Renal ROS  negative genitourinary   Musculoskeletal negative musculoskeletal ROS (+)   Abdominal   Peds  Hematology negative hematology ROS (+)   Anesthesia Other Findings Day of surgery medications reviewed with patient.  Reproductive/Obstetrics negative OB ROS                            Anesthesia Physical Anesthesia Plan  ASA: II  Anesthesia Plan: General   Post-op Pain Management:    Induction: Intravenous  PONV Risk Score and Plan: 2 and Treatment may vary due to age or medical condition, Ondansetron, Dexamethasone and Midazolam  Airway Management Planned: LMA  Additional Equipment: None  Intra-op Plan:   Post-operative Plan: Extubation in OR  Informed Consent: I have reviewed the patients History and Physical, chart, labs and discussed the procedure including the risks, benefits and alternatives for the proposed anesthesia with the patient or authorized representative who has indicated his/her understanding and acceptance.     Dental advisory given  Plan Discussed with: CRNA  Anesthesia Plan Comments:        Anesthesia Quick Evaluation

## 2019-01-06 NOTE — H&P (Addendum)
Orthopaedic Trauma Service (OTS) Consult   Patient ID: Kevin Powers MRN: [id number] DOB/AGE: 60-25-1962 60 y.o.   HPI: Kevin Powers is an 58 y.o. male  who sustained a complex fracture to his left distal tibia and fibula on 12/01/2018.  Patient was up on a ladder approximately 15 feet trying to change a window when he lost his balance and fell on the ground below.  He did land on grass.  Did not come to the skin.  He was unable to bear weight, had severe pain and deformity.  He was brought to [facility] where he was found to have isolated injury to his left distal tibia and fibula.  Orthopedics on-call was consulted.  He was taken emergently to the operating room for application of a spanning external fixator.  Repeat CT scan of his left ankle demonstrated a highly comminuted left intra-articular distal tibia fracture as well as left distal fibula fracture.  Due to the complexity of the injury the orthopedic trauma service was consulted for definitive management.           Patient was seen and evaluated by the orthopedic trauma service the day after admission however his swelling was too severe to allow for safe surgical intervention for definitive ORIF.  Patient was subsequently discharged a few days later.  He was followed closely in the outpatient setting however his swelling never abated enough to allow for definitive ORIF.  He is now 5 weeks post injury and is ready to have his fixator removed.  He does have significant soft tissue injury still present.  Continues to smoke cigarettes as well.  Past Medical History:  Diagnosis Date  . Dysrhythmia   . Nicotine dependence 12/03/2018    Past Surgical History:  Procedure Laterality Date  . EXTERNAL FIXATION LEG Left 12/01/2018   Procedure: 1. Closed treatment of left distal tibia intra-articular fracture with manipulation under anesthesia 2. Closed treatment of left distal fibula fracture with manipulation under anesthesia 3.  Application of multi plane external fixation to left lower extremity;  Surgeon: Toni Arthurs, MD;  Location: [facility] OR;  Service: Orthopedics;  Laterality: Left;    No family history on file.  Social History:  reports that he has been smoking cigarettes. He has a 7.50 pack-year smoking history. He has never used smokeless tobacco. He reports current alcohol use of about 6.0 standard drinks of alcohol per week. He reports current drug use. Frequency: 1.00 time per week. Drug: Marijuana.  Allergies: No Known Allergies  Medications: I have reviewed the patient's current medications.  No results found for this or any previous visit (from the past 48 hour(s)).  No results found.  Review of Systems  Constitutional: Negative for chills and fever.  Respiratory: Negative for shortness of breath and wheezing.   Cardiovascular: Negative for chest pain and palpitations.  Gastrointestinal: Negative for nausea and vomiting.   There were no vitals taken for this visit. Physical Exam Constitutional:      General: He is not in acute distress. Cardiovascular:     Rate and Rhythm: Normal rate and regular rhythm.     Heart sounds: S1 normal and S2 normal.  Pulmonary:     Comments: Decreased bases otherwise clear.  No increased work of breathing.  No respiratory distress Musculoskeletal:     Comments: Left lower extremity External fixator is in place to the left ankle Significant swelling still present.  Significant soft tissue blistering noted to the  anterior, medial and lateral ankle. Extremities warm Palpable DP pulse EHL, FHL and lesser toe motor function are intact + Posterior compartment atrophy Pin sites are stable  Neurological:     Mental Status: He is alert and oriented to person, place, and time.  Psychiatric:        Attention and Perception: Attention normal.        Mood and Affect: Mood normal.        Speech: Speech normal.        Behavior: Behavior is cooperative.              Assessment/Plan:  58 year old male with complex left tibial plafond fracture with retained external fixator  -Complex left tibial plafond fracture with retained external fixator  OR for removal of external fixator, curettage pin sites and placement into a short leg splint  Patient will likely require further reconstructive surgery once his soft tissue wounds heal  Anticipate outpatient procedure.  Risks and benefits have been reviewed with the patient, he wishes to proceed  - Pain management:  Titrate accordingly postoperatively.  Okay for him to receive a preoperative block  - DVT/PE prophylaxis:  No pharmacologic's at discharge  - ID:   Perioperative antibiotics  - Metabolic Bone Disease:  Recheck vitamin D levels  - FEN/GI prophylaxis/Foley/Lines:  Npo  - Impediments to fracture healing:  Nicotine dependence  Vitamin D deficiency  High-energy injury  - Dispo:  OR for removal of external fixator  Likely discharge from [place], PA-C [phone] (C) 01/06/2019, 4:04 PM  [facility] [place] [address] [phone] Jenetta [phone] (F)    I saw and examined the patient with Mr. Eddie Dibbles, communicating the findings and plan noted above.  I discussed with the patient the risks and benefits of surgery, including the possibility of infection, nerve injury, vessel injury, wound breakdown, arthritis, loss of reduction, DVT/ PE, loss of motion, malunion, nonunion, and need for further surgery among others. He acknowledged these risks and wished to proceed.   [place], MD [facility] [phone] (p)

## 2019-01-07 ENCOUNTER — Other Ambulatory Visit: Payer: Self-pay

## 2019-01-07 ENCOUNTER — Ambulatory Visit (HOSPITAL_COMMUNITY): Payer: Medicaid Other | Admitting: Anesthesiology

## 2019-01-07 ENCOUNTER — Ambulatory Visit (HOSPITAL_COMMUNITY): Payer: Medicaid Other

## 2019-01-07 ENCOUNTER — Encounter (HOSPITAL_COMMUNITY)
Admission: RE | Disposition: A | Discharge status: Home / Self Care | Payer: Self-pay | Source: Home / Self Care | Attending: Orthopedic Surgery

## 2019-01-07 ENCOUNTER — Encounter (HOSPITAL_COMMUNITY): Payer: Self-pay | Admitting: *Deleted

## 2019-01-07 ENCOUNTER — Ambulatory Visit (HOSPITAL_COMMUNITY)
Admission: RE | Admit: 2019-01-07 | Discharge: 2019-01-07 | Disposition: A | Discharge status: Home / Self Care | Payer: Medicaid Other | Attending: Orthopedic Surgery | Admitting: Orthopedic Surgery

## 2019-01-07 DIAGNOSIS — T8469XA Infection and inflammatory reaction due to internal fixation device of other site, initial encounter: Secondary | ICD-10-CM | POA: Insufficient documentation

## 2019-01-07 DIAGNOSIS — W11XXXA Fall on and from ladder, initial encounter: Secondary | ICD-10-CM | POA: Diagnosis not present

## 2019-01-07 DIAGNOSIS — S82872A Displaced pilon fracture of left tibia, initial encounter for closed fracture: Secondary | ICD-10-CM | POA: Diagnosis not present

## 2019-01-07 DIAGNOSIS — L97529 Non-pressure chronic ulcer of other part of left foot with unspecified severity: Secondary | ICD-10-CM | POA: Diagnosis not present

## 2019-01-07 DIAGNOSIS — F17210 Nicotine dependence, cigarettes, uncomplicated: Secondary | ICD-10-CM | POA: Insufficient documentation

## 2019-01-07 DIAGNOSIS — T148XXA Other injury of unspecified body region, initial encounter: Secondary | ICD-10-CM

## 2019-01-07 DIAGNOSIS — Y828 Other medical devices associated with adverse incidents: Secondary | ICD-10-CM | POA: Diagnosis not present

## 2019-01-07 DIAGNOSIS — T1490XA Injury, unspecified, initial encounter: Secondary | ICD-10-CM

## 2019-01-07 HISTORY — DX: Cardiac arrhythmia, unspecified: I49.9

## 2019-01-07 HISTORY — PX: EXTERNAL FIXATION REMOVAL: SHX5040

## 2019-01-07 LAB — CBC
HCT: 36.4 % — ABNORMAL LOW (ref 39.0–52.0)
Hemoglobin: 11.7 g/dL — ABNORMAL LOW (ref 13.0–17.0)
MCH: 28.3 pg (ref 26.0–34.0)
MCHC: 32.1 g/dL (ref 30.0–36.0)
MCV: 87.9 fL (ref 80.0–100.0)
Platelets: 308 10*3/uL (ref 150–400)
RBC: 4.14 MIL/uL — ABNORMAL LOW (ref 4.22–5.81)
RDW: 14.3 % (ref 11.5–15.5)
WBC: 7.3 10*3/uL (ref 4.0–10.5)
nRBC: 0 % (ref 0.0–0.2)

## 2019-01-07 LAB — RAPID URINE DRUG SCREEN, HOSP PERFORMED
Amphetamines: NOT DETECTED
Barbiturates: NOT DETECTED
Benzodiazepines: NOT DETECTED
Cocaine: NOT DETECTED
Opiates: POSITIVE — AB
Tetrahydrocannabinol: POSITIVE — AB

## 2019-01-07 LAB — BASIC METABOLIC PANEL
Anion gap: 11 (ref 5–15)
BUN: 13 mg/dL (ref 6–20)
CO2: 26 mmol/L (ref 22–32)
Calcium: 9 mg/dL (ref 8.9–10.3)
Chloride: 100 mmol/L (ref 98–111)
Creatinine, Ser: 0.91 mg/dL (ref 0.61–1.24)
GFR calc Af Amer: >60 mL/min (ref >60)
GFR calc non Af Amer: >60 mL/min (ref >60)
Glucose, Bld: 107 mg/dL — ABNORMAL HIGH (ref 70–99)
Potassium: 3.7 mmol/L (ref 3.5–5.1)
Sodium: 137 mmol/L (ref 135–145)

## 2019-01-07 LAB — VITAMIN D 25 HYDROXY (VIT D DEFICIENCY, FRACTURES): Vit D, 25-Hydroxy: 13.85 ng/mL — ABNORMAL LOW (ref 30–100)

## 2019-01-07 SURGERY — REMOVAL, EXTERNAL FIXATION DEVICE, LOWER EXTREMITY
Anesthesia: General | Site: Leg Lower | Laterality: Left

## 2019-01-07 MED ORDER — MIDAZOLAM HCL 2 MG/2ML IJ SOLN
INTRAMUSCULAR | Status: DC | PRN
  Administered 2019-01-07: 2 mg via INTRAVENOUS

## 2019-01-07 MED ORDER — PROPOFOL 10 MG/ML IV BOLUS
INTRAVENOUS | Status: DC | PRN
  Administered 2019-01-07: 160 mg via INTRAVENOUS

## 2019-01-07 MED ORDER — CHLORHEXIDINE GLUCONATE 4 % EX LIQD: 60.0000 mL | Freq: Once | CUTANEOUS | Status: DC

## 2019-01-07 MED ORDER — ACETAMINOPHEN 10 MG/ML IV SOLN
INTRAVENOUS | Status: DC | PRN
  Administered 2019-01-07: 1000 mg via INTRAVENOUS

## 2019-01-07 MED ORDER — OXYCODONE HCL 5 MG PO TABS
ORAL_TABLET | ORAL | Status: AC
  Filled 2019-01-07: qty 1

## 2019-01-07 MED ORDER — FENTANYL CITRATE (PF) 100 MCG/2ML IJ SOLN
25.0000 ug | INTRAMUSCULAR | Status: DC | PRN
  Administered 2019-01-07: 50 ug via INTRAVENOUS

## 2019-01-07 MED ORDER — ACETAMINOPHEN 500 MG PO TABS: 1000.0000 mg | ORAL_TABLET | Freq: Once | ORAL | Status: DC

## 2019-01-07 MED ORDER — DEXAMETHASONE SODIUM PHOSPHATE 10 MG/ML IJ SOLN
INTRAMUSCULAR | Status: DC | PRN
  Administered 2019-01-07: 5 mg via INTRAVENOUS

## 2019-01-07 MED ORDER — FENTANYL CITRATE (PF) 250 MCG/5ML IJ SOLN
INTRAMUSCULAR | Status: AC
  Filled 2019-01-07: qty 5

## 2019-01-07 MED ORDER — PROPOFOL 10 MG/ML IV BOLUS
INTRAVENOUS | Status: AC
  Filled 2019-01-07: qty 20

## 2019-01-07 MED ORDER — PROMETHAZINE HCL 25 MG/ML IJ SOLN: 6.2500 mg | INTRAMUSCULAR | Status: DC | PRN

## 2019-01-07 MED ORDER — FENTANYL CITRATE (PF) 100 MCG/2ML IJ SOLN
INTRAMUSCULAR | Status: DC | PRN
  Administered 2019-01-07: 50 ug via INTRAVENOUS

## 2019-01-07 MED ORDER — MIDAZOLAM HCL 2 MG/2ML IJ SOLN
INTRAMUSCULAR | Status: AC
  Filled 2019-01-07: qty 2

## 2019-01-07 MED ORDER — ACETAMINOPHEN 10 MG/ML IV SOLN
INTRAVENOUS | Status: AC
  Filled 2019-01-07: qty 100

## 2019-01-07 MED ORDER — CEFAZOLIN SODIUM-DEXTROSE 2-4 GM/100ML-% IV SOLN
2.0000 g | INTRAVENOUS | Status: AC
  Administered 2019-01-07: 2 g via INTRAVENOUS

## 2019-01-07 MED ORDER — OXYCODONE HCL 5 MG PO TABS
5.0000 mg | ORAL_TABLET | Freq: Once | ORAL | Status: AC | PRN
  Administered 2019-01-07: 5 mg via ORAL

## 2019-01-07 MED ORDER — ONDANSETRON HCL 4 MG/2ML IJ SOLN
INTRAMUSCULAR | Status: AC
  Filled 2019-01-07: qty 2

## 2019-01-07 MED ORDER — ONDANSETRON HCL 4 MG/2ML IJ SOLN
INTRAMUSCULAR | Status: DC | PRN
  Administered 2019-01-07: 4 mg via INTRAVENOUS

## 2019-01-07 MED ORDER — LIDOCAINE 2% (20 MG/ML) 5 ML SYRINGE
INTRAMUSCULAR | Status: DC | PRN
  Administered 2019-01-07 (×2): 40 mg via INTRAVENOUS

## 2019-01-07 MED ORDER — FENTANYL CITRATE (PF) 100 MCG/2ML IJ SOLN
INTRAMUSCULAR | Status: AC
  Filled 2019-01-07: qty 2

## 2019-01-07 MED ORDER — DEXAMETHASONE SODIUM PHOSPHATE 10 MG/ML IJ SOLN
INTRAMUSCULAR | Status: AC
  Filled 2019-01-07: qty 1

## 2019-01-07 MED ORDER — 0.9 % SODIUM CHLORIDE (POUR BTL) OPTIME
TOPICAL | Status: DC | PRN
  Administered 2019-01-07: 1000 mL

## 2019-01-07 MED ORDER — LACTATED RINGERS IV SOLN
INTRAVENOUS | Status: DC
  Administered 2019-01-07: 07:00:00 via INTRAVENOUS

## 2019-01-07 MED ORDER — OXYCODONE HCL 5 MG/5ML PO SOLN: 5.0000 mg | Freq: Once | ORAL | Status: AC | PRN

## 2019-01-07 MED ORDER — LIDOCAINE 2% (20 MG/ML) 5 ML SYRINGE
INTRAMUSCULAR | Status: AC
  Filled 2019-01-07: qty 5

## 2019-01-07 SURGICAL SUPPLY — 37 items
BNDG ELASTIC 3X5.8 VLCR STR LF (GAUZE/BANDAGES/DRESSINGS) ×3 IMPLANT
BNDG ELASTIC 4X5.8 VLCR STR LF (GAUZE/BANDAGES/DRESSINGS) ×3 IMPLANT
BNDG ELASTIC 6X5.8 VLCR STR LF (GAUZE/BANDAGES/DRESSINGS) ×3 IMPLANT
BNDG GAUZE ELAST 4 BULKY (GAUZE/BANDAGES/DRESSINGS) ×3 IMPLANT
BRUSH SCRUB EZ PLAIN DRY (MISCELLANEOUS) ×6 IMPLANT
COVER SURGICAL LIGHT HANDLE (MISCELLANEOUS) ×3 IMPLANT
COVER WAND RF STERILE (DRAPES) ×3 IMPLANT
DRAPE C-ARM 42X72 X-RAY (DRAPES) ×3 IMPLANT
DRAPE C-ARMOR (DRAPES) IMPLANT
DRAPE U-SHAPE 47X51 STRL (DRAPES) ×3 IMPLANT
DRSG MEPITEL 4X7.2 (GAUZE/BANDAGES/DRESSINGS) ×6 IMPLANT
ELECT REM PT RETURN 9FT ADLT (ELECTROSURGICAL) ×3
ELECTRODE REM PT RTRN 9FT ADLT (ELECTROSURGICAL) ×1 IMPLANT
GAUZE SPONGE 4X4 12PLY STRL (GAUZE/BANDAGES/DRESSINGS) ×3 IMPLANT
GLOVE BIO SURGEON STRL SZ7.5 (GLOVE) ×3 IMPLANT
GLOVE BIO SURGEON STRL SZ8 (GLOVE) ×3 IMPLANT
GLOVE BIOGEL PI IND STRL 7.5 (GLOVE) ×1 IMPLANT
GLOVE BIOGEL PI IND STRL 8 (GLOVE) ×1 IMPLANT
GLOVE BIOGEL PI INDICATOR 7.5 (GLOVE) ×2
GLOVE BIOGEL PI INDICATOR 8 (GLOVE) ×2
GOWN STRL REUS W/ TWL LRG LVL3 (GOWN DISPOSABLE) ×2 IMPLANT
GOWN STRL REUS W/ TWL XL LVL3 (GOWN DISPOSABLE) ×1 IMPLANT
GOWN STRL REUS W/TWL LRG LVL3 (GOWN DISPOSABLE) ×4
GOWN STRL REUS W/TWL XL LVL3 (GOWN DISPOSABLE) ×2
KIT BASIN OR (CUSTOM PROCEDURE TRAY) ×3 IMPLANT
KIT TURNOVER KIT B (KITS) ×3 IMPLANT
MANIFOLD NEPTUNE II (INSTRUMENTS) ×3 IMPLANT
NS IRRIG 1000ML POUR BTL (IV SOLUTION) ×3 IMPLANT
PACK ORTHO EXTREMITY (CUSTOM PROCEDURE TRAY) ×3 IMPLANT
PAD ARMBOARD 7.5X6 YLW CONV (MISCELLANEOUS) ×6 IMPLANT
PADDING CAST COTTON 6X4 STRL (CAST SUPPLIES) ×6 IMPLANT
SPONGE LAP 18X18 RF (DISPOSABLE) ×3 IMPLANT
STAPLER VISISTAT 35W (STAPLE) IMPLANT
TOWEL GREEN STERILE (TOWEL DISPOSABLE) ×6 IMPLANT
TOWEL GREEN STERILE FF (TOWEL DISPOSABLE) ×6 IMPLANT
UNDERPAD 30X30 (UNDERPADS AND DIAPERS) ×3 IMPLANT
WATER STERILE IRR 1000ML POUR (IV SOLUTION) ×6 IMPLANT

## 2019-01-07 NOTE — Transfer of Care (Signed)
Immediate Anesthesia Transfer of Care Note  Patient: Kevin Powers  Procedure(s) Performed: REMOVAL EXTERNAL FIXATION LEFT  LEG (Left Leg Lower)  Patient Location: PACU  Anesthesia Type:General  Level of Consciousness: awake, alert  and oriented  Airway & Oxygen Therapy: Patient Spontanous Breathing and Patient connected to nasal cannula oxygen  Post-op Assessment: Report given to RN and Post -op Vital signs reviewed and stable  Post vital signs: Reviewed and stable  Last Vitals:  Vitals Value Taken Time  BP 156/92 01/07/19 0906  Temp 36.4 C 01/07/19 0906  Pulse 71 01/07/19 0907  Resp 14 01/07/19 0907  SpO2 93 % 01/07/19 0907  Vitals shown include unvalidated device data.  Last Pain:  Vitals:   01/07/19 0906  TempSrc:   PainSc: (P) Asleep      Patients Stated Pain Goal: 4 (50/56/97 9480)  Complications: No apparent anesthesia complications

## 2019-01-07 NOTE — Discharge Instructions (Addendum)
[facility] Discharge Instructions   General Discharge Instructions   WEIGHT BEARING STATUS: Nonweightbearing Left leg   RANGE OF MOTION/ACTIVITY: do not remove splint on left ankle/leg. Ok to move toes and knee. Activity as tolerated while maintaining weightbearing restrictions   Wound Care: do not remove splint, keep splint clean and dry   Diet: as you were eating previously.  Can use over the counter stool softeners and bowel preparations, such as Miralax, to help with bowel movements.  Narcotics can be constipating.  Be sure to drink plenty of fluids  PAIN MEDICATION USE AND EXPECTATIONS  You have likely been given narcotic medications to help control your pain.  After a traumatic event that results in an fracture (broken bone) with or without surgery, it is ok to use narcotic pain medications to help control one's pain.  We understand that everyone responds to pain differently and each individual patient will be evaluated on a regular basis for the continued need for narcotic medications. Ideally, narcotic medication use should last no more than 6-8 weeks (coinciding with fracture healing).   As a patient it is your responsibility as well to monitor narcotic medication use and report the amount and frequency you use these medications when you come to your office visit.   We would also advise that if you are using narcotic medications, you should take a dose prior to therapy to maximize you participation.  IF YOU ARE ON NARCOTIC MEDICATIONS IT IS NOT PERMISSIBLE TO OPERATE A MOTOR VEHICLE (MOTORCYCLE/CAR/TRUCK/MOPED) OR HEAVY MACHINERY DO NOT MIX NARCOTICS WITH OTHER CNS (CENTRAL NERVOUS SYSTEM) DEPRESSANTS SUCH AS ALCOHOL   STOP SMOKING OR USING NICOTINE PRODUCTS!!!!  As discussed nicotine severely impairs your body's ability to heal surgical and traumatic wounds but also impairs bone healing.  Wounds and bone heal by forming microscopic blood vessels (angiogenesis)  and nicotine is a vasoconstrictor (essentially, shrinks blood vessels).  Therefore, if vasoconstriction occurs to these microscopic blood vessels they essentially disappear and are unable to deliver necessary nutrients to the healing tissue.  This is one modifiable factor that you can do to dramatically increase your chances of healing your injury.    (This means no smoking, no nicotine gum, patches, etc)  DO NOT USE NONSTEROIDAL ANTI-INFLAMMATORY DRUGS (NSAID'S)  Using products such as Advil (ibuprofen), Aleve (naproxen), Motrin (ibuprofen) for additional pain control during fracture healing can delay and/or prevent the healing response.  If you would like to take over the counter (OTC) medication, Tylenol (acetaminophen) is ok.  However, some narcotic medications that are given for pain control contain acetaminophen as well. Therefore, you should not exceed more than 4000 mg of tylenol in a day if you do not have liver disease.  Also note that there are may OTC medicines, such as cold medicines and allergy medicines that my contain tylenol as well.  If you have any questions about medications and/or interactions please ask your doctor/PA or your pharmacist.      ICE AND ELEVATE INJURED/OPERATIVE EXTREMITY  Using ice and elevating the injured extremity above your heart can help with swelling and pain control.  Icing in a pulsatile fashion, such as 20 minutes on and 20 minutes off, can be followed.    Do not place ice directly on skin. Make sure there is a barrier between to skin and the ice pack.    Using frozen items such as frozen peas works well as the conform nicely to the are that needs to be iced.  USE AN ACE WRAP OR TED HOSE FOR SWELLING CONTROL  In addition to icing and elevation, Ace wraps or TED hose are used to help limit and resolve swelling.  It is recommended to use Ace wraps or TED hose until you are informed to stop.    When using Ace Wraps start the wrapping distally (farthest away  from the body) and wrap proximally (closer to the body)   Example: If you had surgery on your leg or thing and you do not have a splint on, start the ace wrap at the toes and work your way up to the thigh        If you had surgery on your upper extremity and do not have a splint on, start the ace wrap at your fingers and work your way up to the upper arm  IF YOU ARE IN A SPLINT OR CAST DO NOT [place]   If your splint gets wet for any reason please contact the office immediately. You may shower in your splint or cast as long as you keep it dry.  This can be done by wrapping in a cast cover or garbage back (or similar)  Do Not stick any thing down your splint or cast such as pencils, money, or hangers to try and scratch yourself with.  If you feel itchy take benadryl as prescribed on the bottle for itching  IF YOU ARE IN A CAM BOOT (BLACK BOOT)  You may remove boot periodically. Perform daily dressing changes as noted below.  Wash the liner of the boot regularly and wear a sock when wearing the boot. It is recommended that you sleep in the boot until told otherwise    Call office for the following:  Temperature greater than 101F  Persistent nausea and vomiting  Severe uncontrolled pain  Redness, tenderness, or signs of infection (pain, swelling, redness, odor or green/yellow discharge around the site)  Difficulty breathing, headache or visual disturbances  Hives  Persistent dizziness or light-headedness  Extreme fatigue  Any other questions or concerns you may have after discharge  In an emergency, call 911 or go to an Emergency Department at a nearby hospital    [place]: [phone]   VISIT OUR WEBSITE FOR ADDITIONAL INFORMATION: [email or web address]      Cast or Splint Care, Adult Casts and splints are supports that are worn to protect broken bones and other injuries. A cast or splint may hold a bone still and in the  correct position while it heals. Casts and splints may also help to ease pain, swelling, and muscle spasms. How to care for your cast   Do not stick anything inside the cast to scratch your skin.  Check the skin around the cast every day. Tell your doctor about any concerns.  You may put lotion on dry skin around the edges of the cast. Do not put lotion on the skin under the cast.  Keep the cast clean.  If the cast is not waterproof: ? Do not let it get wet. ? Cover it with a watertight covering when you take a bath or a shower. How to care for your splint   Wear it as told by your doctor. Take it off only as told by your doctor.  Loosen the splint if your fingers or toes tingle, get numb, or turn cold and blue.  Keep the splint clean.  If the splint is not waterproof: ?  Do not let it get wet. ? Cover it with a watertight covering when you take a bath or a shower. Follow these instructions at home: Bathing  Do not take baths or swim until your doctor says it is okay. Ask your doctor if you can take showers. You may only be allowed to take sponge baths for bathing.  If your cast or splint is not waterproof, cover it with a watertight covering when you take a bath or shower. Managing pain, stiffness, and swelling  Move your fingers or toes often to avoid stiffness and to lessen swelling.  Raise (elevate) the injured area above the level of your heart while sitting or lying down. Safety  Do not use the injured limb to support your body weight until your doctor says that it is okay.  Use crutches or other assistive devices as told by your doctor. General instructions  Do not put pressure on any part of the cast or splint until it is fully hardened. This may take many hours.  Return to your normal activities as told by your doctor. Ask your doctor what activities are safe for you.  Keep all follow-up visits as told by your doctor. This is important. Contact a doctor  if:  Your cast or splint gets damaged.  The skin around the cast gets red or raw.  The skin under the cast is very itchy or painful.  Your cast or splint feels very uncomfortable.  Your cast or splint is too tight or too loose.  Your cast becomes wet or it starts to have a soft spot or area.  You get an object stuck under your cast. Get help right away if:  Your pain gets worse.  The injured area tingles, gets numb, or turns blue and cold.  The part of your body above or below the cast is swollen and it turns a different color (is discolored).  You cannot feel or move your fingers or toes.  There is fluid leaking through the cast.  You have very bad pain or pressure under the cast.  You have trouble breathing.  You have shortness of breath.  You have chest pain. This information is not intended to replace advice given to you by your health care provider. Make sure you discuss any questions you have with your health care provider. Document Released: 06/12/2010 Document Revised: 06/02/2018 Document Reviewed: 02/01/2016 Elsevier Patient Education  2020 [employer]

## 2019-01-07 NOTE — Progress Notes (Signed)
Fentanyl 50 mcg wasted with [place] post d/c

## 2019-01-07 NOTE — Anesthesia Procedure Notes (Signed)
Procedure Name: LMA Insertion Date/Time: 01/07/2019 8:11 AM Performed by: Trinna Post., CRNA Pre-anesthesia Checklist: Patient identified, Emergency Drugs available, Suction available, Patient being monitored and Timeout performed Patient Re-evaluated:Patient Re-evaluated prior to induction Oxygen Delivery Method: Circle system utilized Preoxygenation: Pre-oxygenation with 100% oxygen Induction Type: IV induction Ventilation: Mask ventilation without difficulty LMA: LMA inserted LMA Size: 4.0 Number of attempts: 1 Placement Confirmation: positive ETCO2 and breath sounds checked- equal and bilateral Tube secured with: Tape Dental Injury: Teeth and Oropharynx as per pre-operative assessment

## 2019-01-07 NOTE — Op Note (Addendum)
01/07/2019  9:47 AM  PATIENT:  Kevin Powers  58 y.o. male  PRE-OPERATIVE DIAGNOSIS:   1. RETAINED EXTERNALFIXATOR LEFT ANKLE 2. ULCERATED PIN SITES LEG AND HEEL  POST-OPERATIVE DIAGNOSIS:   1. RETAINED EXTERNALFIXATOR LEFT ANKLE 2. ULCERATED PIN SITES LEG AND HEEL  PROCEDURE:  Procedure(s): 1. REMOVAL EXTERNAL FIXATION  LEFT LEG (Left) 2. DEBRIDEMENT OF ULCERATED PIN SITES 3. STRESS FLUOROSCOPY OF LEFT ANKLE  SURGEON:  Surgeon(s) and Role:    * Altamese [place], MD - Primary  PHYSICIAN ASSISTANT: None  ANESTHESIA:   general  EBL:  40 mL   BLOOD ADMINISTERED:none  DRAINS: none   LOCAL MEDICATIONS USED:  NONE  SPECIMEN:  No Specimen  DISPOSITION OF SPECIMEN:  N/A  COUNTS:  YES  TOURNIQUET: None  DICTATION: Note written in EPIC  PLAN OF CARE: Discharge to home after PACU  PATIENT DISPOSITION:  PACU - hemodynamically stable.   Delay start of Pharmacological VTE agent (>24hrs) due to surgical blood loss or risk of bleeding: no  INDICATIONS: Patient is s/p external fixation of left pilon fracture. Has gone on to possible heal and now presents for removal. I discussed with the patient the risks and benefits of surgery, including the possibility of infection, nerve injury, vessel injury, wound breakdown, arthritis, symptomatic hardware, DVT/ PE, loss of motion, malunion, nonunion, and need for further surgery among others.  He acknowledged these risks and wished to proceed.  BRIEF SUMMARY: After administration of preoperative antibiotics the patient was taken to the operating room and general anesthesia induced. A time out was held. The fixator clamps were then loosened and the pins removed, followed by a thorough scrubbing with chlorhexidine and wash. The pin sites, which had ulcerated at the leg and heel around the pins, were then debrided with curettes at the skin, subcutaneous tissues, muscle fascia layers, as well as the near and far bone cortices, removing all  discernible devitalized necrotic tissue, bone debris, and desiccated fibrinous material. The calcaneal pin tract was debrided with curettage all the way through the bone canal to the opposite side of the calcaneus. This canal and all pin sites were thoroughly irrigated with saline.   C-arm was then brought in and AP, mortise, and lateral views obtained of the ankle to confirm healing and maintenance of reduction. I also applied lateral and external rotation force in the mortise projection while using live fluoro x-ray to confirm that there was no apparent syndesmotic instability.   The wounds were then dressed with adaptic, gauze, softly compressive dressing followed by a posterior and stirrup splint. Ainsley Spinner, PA-C, was present and assisting throughout.  PROGNOSIS: Patient will be NWB in a splint until follow up in the office in 10 days at which time we anticipate transitioning into a CAM boot and beginning partial WBAT. No formal DVT prophylaxis is required given anticipated mobilization and time from initial surgery and injury.  Altamese [place], MD [facility], [facility] [phone]

## 2019-01-07 NOTE — Anesthesia Postprocedure Evaluation (Signed)
Anesthesia Post Note  Patient: Kevin Powers  Procedure(s) Performed: REMOVAL EXTERNAL FIXATION LEFT  LEG (Left Leg Lower)     Anesthesia Type: General    Last Vitals:  Vitals:   01/07/19 0609 01/07/19 0906  BP: (!) 144/98 (!) 156/92  Pulse: 70 85  Resp: 18 18  Temp: 36.6 C 36.4 C  SpO2: 98% 94%    Last Pain:  Vitals:   01/07/19 0906  TempSrc:   PainSc: [place]

## 2019-01-08 ENCOUNTER — Encounter (HOSPITAL_COMMUNITY): Payer: Self-pay | Admitting: Orthopedic Surgery

## 2019-03-17 ENCOUNTER — Ambulatory Visit (INDEPENDENT_AMBULATORY_CARE_PROVIDER_SITE_OTHER): Payer: Self-pay | Admitting: Plastic Surgery

## 2019-03-17 ENCOUNTER — Encounter: Payer: Self-pay | Admitting: Plastic Surgery

## 2019-03-17 ENCOUNTER — Other Ambulatory Visit: Payer: Self-pay

## 2019-03-17 VITALS — BP 137/85 | HR 86 | Temp 99.5°F | Ht 66.0 in | Wt 160.0 lb

## 2019-03-17 DIAGNOSIS — S91002D Unspecified open wound, left ankle, subsequent encounter: Secondary | ICD-10-CM

## 2019-03-17 NOTE — Progress Notes (Signed)
Referring Provider Kevin [place], MD [place],  [place] [address]   CC:  Chief Complaint  Patient presents with  . Advice Only    wound of the left ankle      Kevin Powers is an 59 y.o. male.  HPI: Patient presents with chronic wound in his left ankle.  He fell off a ladder few months ago and sustained distal tibia and fibula fracture.  This was treated surgically and ended up with an exfix which was removed about 2 months ago now.  The area around the pin sites was debrided but on the anterior aspect of his ankle all he is developed a chronic wound has been slow to heal.  It sounds like there was tendons exposed in that area initially.  Not particular tender for him and he has not had any fevers but it does not seem to be changing much in terms of the healing process.  All of his hardware appears to have been removed.  No Known Allergies  Outpatient Encounter Medications as of 03/17/2019  Medication Sig  . acetaminophen (TYLENOL) 500 MG tablet Take 1 tablet (500 mg total) by mouth every 12 (twelve) hours.  Marland Kitchen oxyCODONE-acetaminophen (PERCOCET) 7.5-325 MG tablet Take 1-2 tablets by mouth every 6 (six) hours as needed for severe pain.   No facility-administered encounter medications on file as of 03/17/2019.     Past Medical History:  Diagnosis Date  . Dysrhythmia   . Nicotine dependence 12/03/2018    Past Surgical History:  Procedure Laterality Date  . EXTERNAL FIXATION LEG Left 12/01/2018   Procedure: 1. Closed treatment of left distal tibia intra-articular fracture with manipulation under anesthesia 2. Closed treatment of left distal fibula fracture with manipulation under anesthesia 3. Application of multi plane external fixation to left lower extremity;  Surgeon: Kevin Simmer, MD;  Location: [place];  Service: Orthopedics;  Laterality: Left;  . EXTERNAL FIXATION REMOVAL Left 01/07/2019   Procedure: REMOVAL EXTERNAL FIXATION LEFT  LEG;  Surgeon: Kevin [place], MD;   Location: [place];  Service: Orthopedics;  Laterality: Left;    No family history on file.  Social History   Social History Narrative  . Not on file     Review of Systems General: Denies fevers, chills, weight loss CV: Denies chest pain, shortness of breath, palpitations  Physical Exam Vitals with BMI 03/17/2019 01/07/2019 01/07/2019  Height 5\' 6"  - -  Weight 160 lbs - -  BMI 78.93 - -  Systolic 810 175 102  Diastolic 85 82 89  Pulse 86 72 55    General:  No acute distress,  Alert and oriented, Non-Toxic, Normal speech and affect Left lower extremity: Toes are well perfused with normal capillary refill.  He has a palpable DP and PT pulse.  He has normal sensation.  He has 2 to 3 cm wound with dry eschar in the area where I expect the anterior tibialis tendon to be.  There is a small amount of drainage but no obvious purulence today.  There is a little bit of tenderness proximal to the wound.  The only imaging I have available is from when his exfix was removed back in November.  She has what I interpret to be reasonably aligned fracture with some comminution of the tibia and fibula.  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  I recommended debridement and likely placement of Integra in this area to try to stimulate healing.  If possible he has an underlying bony infection and if I see anything unusual I may take a small biopsy to send for culture.  I explained its a difficult area to heal but I think given the fact that it is not progressing with more conservative measures operative debridement is reasonable.  I discussed the risk with him include bleeding, infection, damage surrounding structures, need for additional procedures.  He understands he may require a second stage reconstruction if the Integra takes.  We will plan to get this scheduled soon for him.  Allena Napoleon 03/17/2019, 2:31 PM

## 2021-02-19 IMAGING — CT CT ANKLE*L* W/O CM
3 series · 15 of 35 positions shown, 18 images · non-contrast
Comparison: December 01, 2018

CLINICAL DATA: Evaluate extent of left tibial Rudi fracture

EXAM:
CT OF THE LEFT ANKLE WITHOUT CONTRAST
TECHNIQUE: Multidetector CT imaging of the left ankle was performed according
to the standard protocol. Multiplanar CT image reconstructions were
also generated.

[Series 4: lower ext 1.5 st · axial · 0.43mm/px · z∈[+240,+367]mm · 7 of 101 slices shown, 9 images]
[im 8/101  soft-tissue]
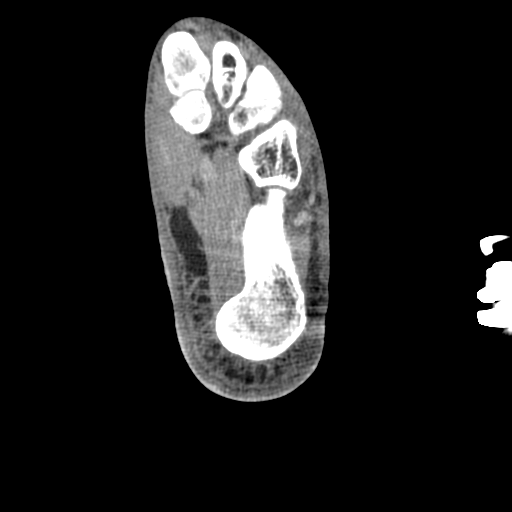
[im 8/101  bone]
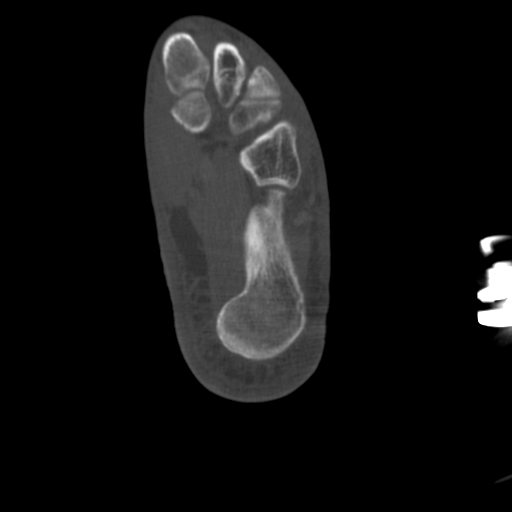
[im 24/101  bone]
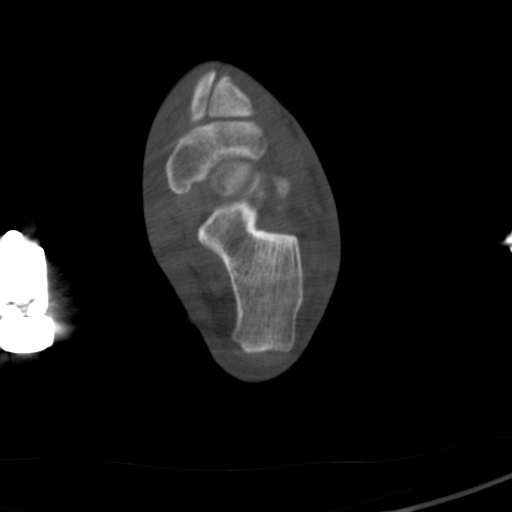
[im 39/101  bone]
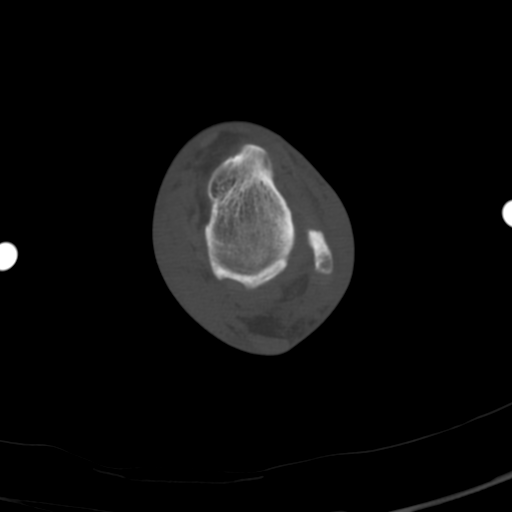
[im 54/101  bone]
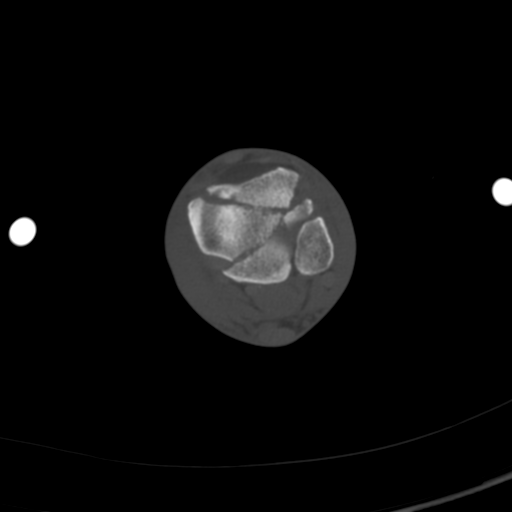
[im 62/101  soft-tissue]
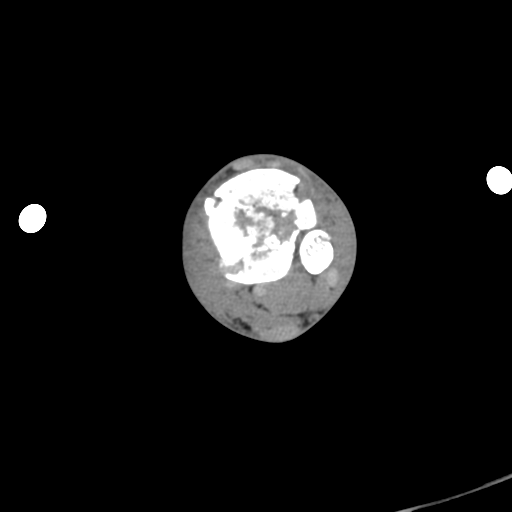
[im 62/101  bone]
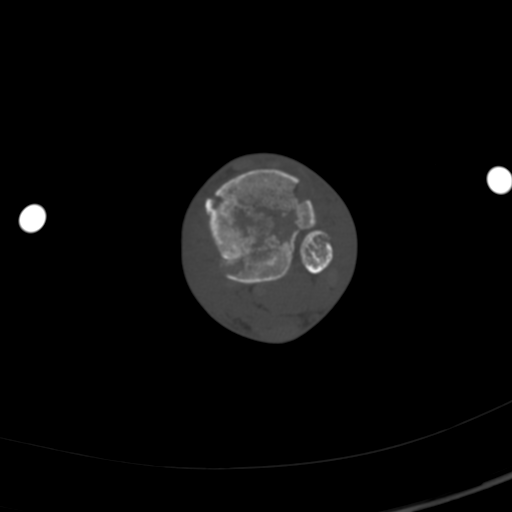
[im 77/101  bone]
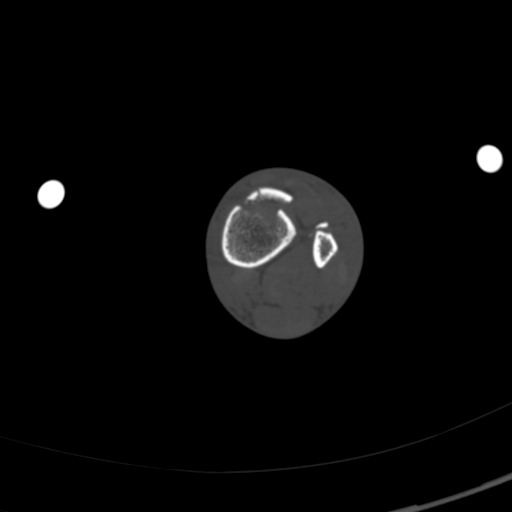
[im 93/101  bone]
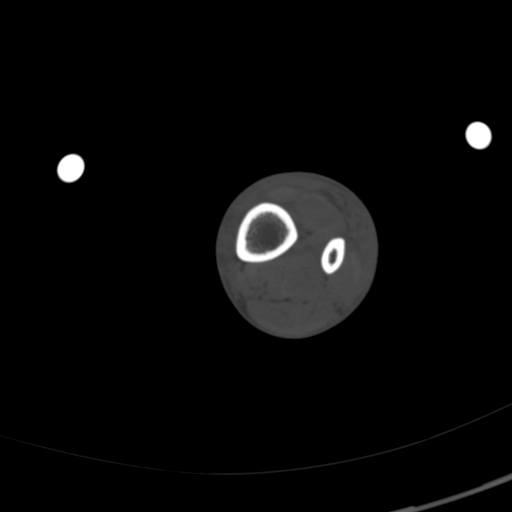

[Series 9: lower ext cor st · coronal · 0.24mm/px · 3 of 108 slices shown]
[im 22/108  bone]
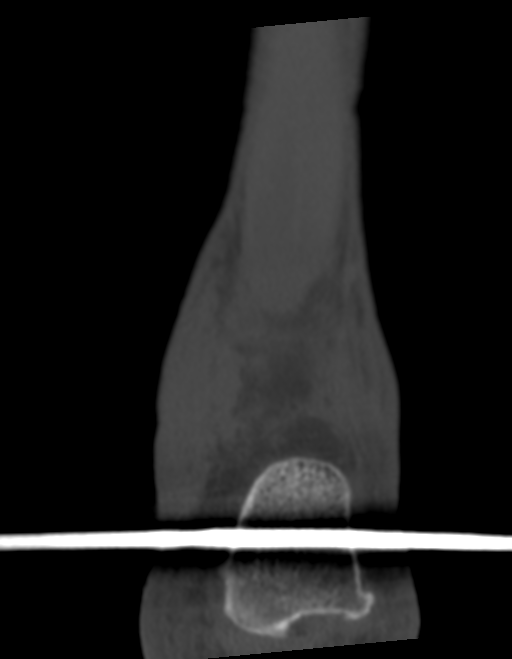
[im 43/108  bone]
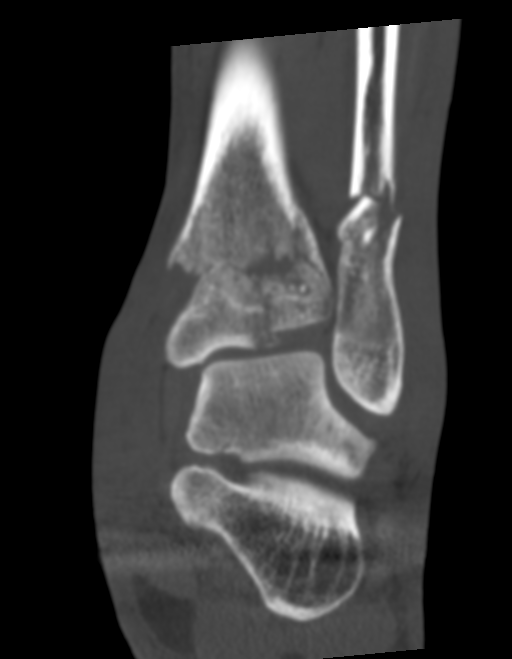
[im 65/108  bone]
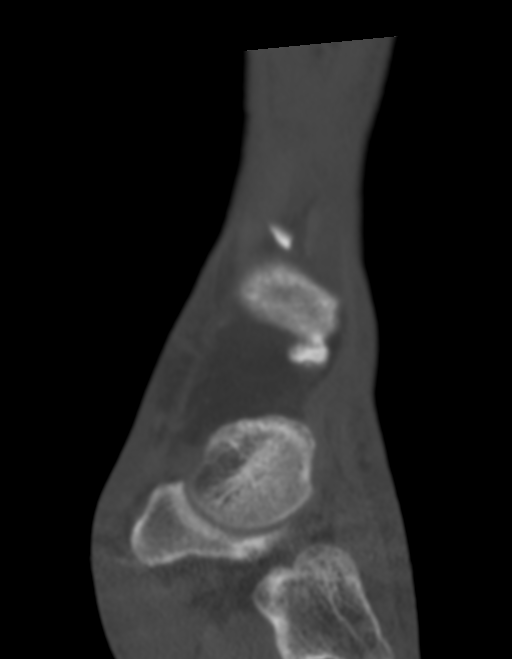

[Series 10: lower ext sag st · sagittal · 0.30mm/px · 5 of 88 slices shown, 6 images]
[im 30/88  bone]
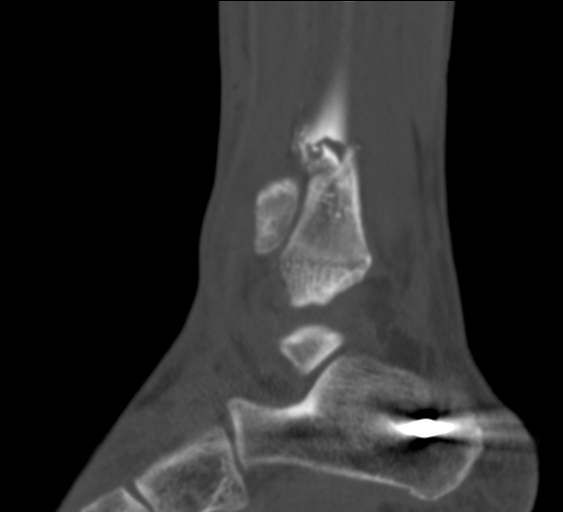
[im 37/88  bone]
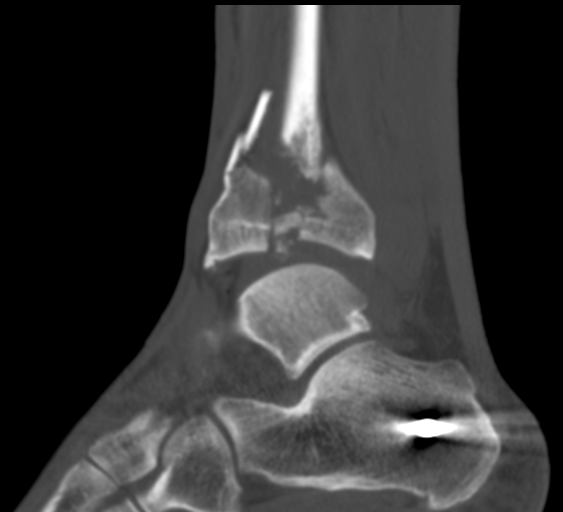
[im 44/88  soft-tissue]
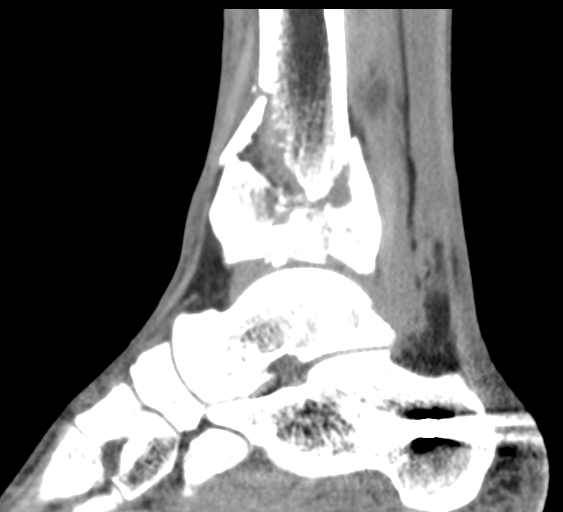
[im 44/88  bone]
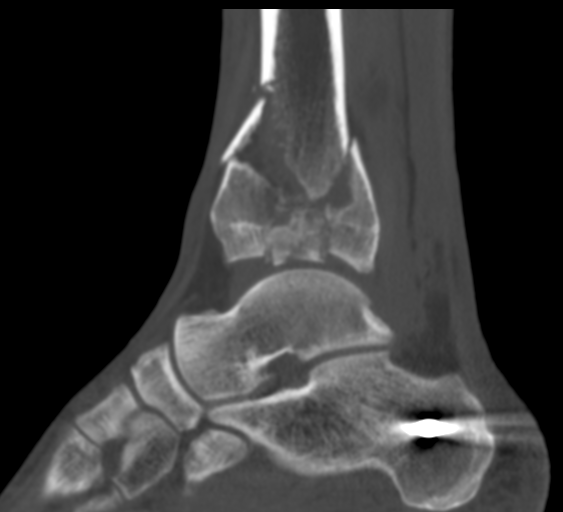
[im 51/88  bone]
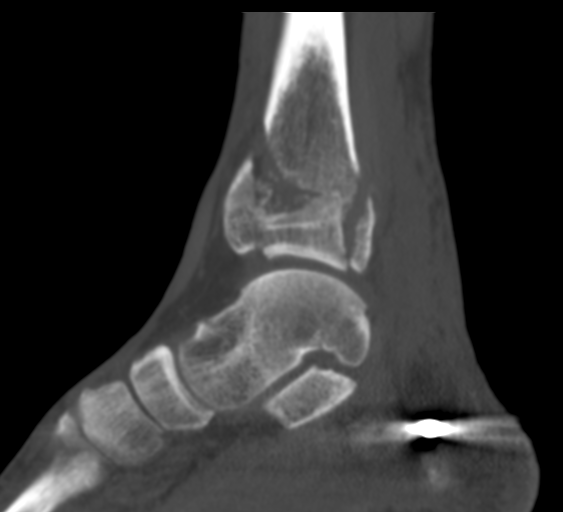
[im 59/88  bone]
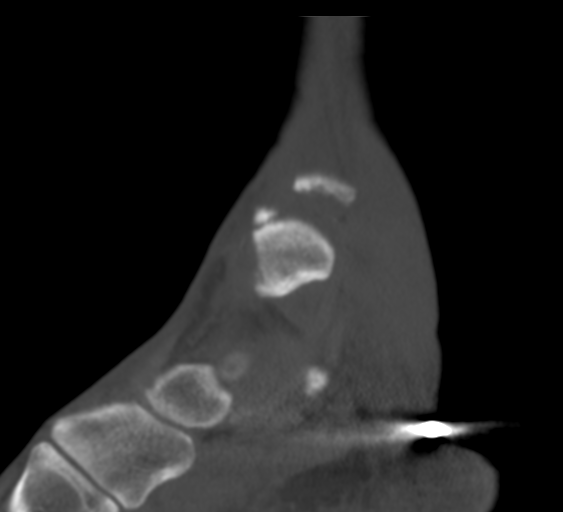

[15 of 35 positions shown; findings below may reference images not displayed]

FINDINGS: Bones/Joint/Cartilage

There is extensive comminuted fracture of the distal tibia with
extensive intra-articular extension and impaction. There is slight
displacement and impaction of the tibial shaft on the of the
fracture fragment seen along the posteromedial border. There is the
medial malleolus still appears to be intact. There is a comminuted
impacted distal fibular fracture. The lateral malleolus, however
appears to be intact. No other fracture is identified. A small
amount of subcutaneous emphysema seen within the anterior tibiotalar
joint.

Ligaments

Suboptimally assessed by CT.

Muscles and Tendons

The muscles surrounding the ankle appear to be intact. The flexor
and extensor tendons appear to be grossly intact. The iliopsoas
tendon is intact.

Soft tissues

There is diffuse soft tissue edema seen around the ankle. A small
ankle joint effusion is seen.
IMPRESSION: 1. Findings of a type 3 distal tibial Rudi fracture.
2. Comminuted impacted distal fibular fracture.

## 2021-03-27 IMAGING — CR DG ANKLE COMPLETE 3+V*L*
3 series · 3 of 3 positions shown · non-contrast
Comparison: Intraoperative left ankle radiographs from earlier
today and from 12/01/2018.

CLINICAL DATA: Removal of external fixation hardware from the left
ankle

EXAM:
LEFT ANKLE COMPLETE - 3+ VIEW

[AP]
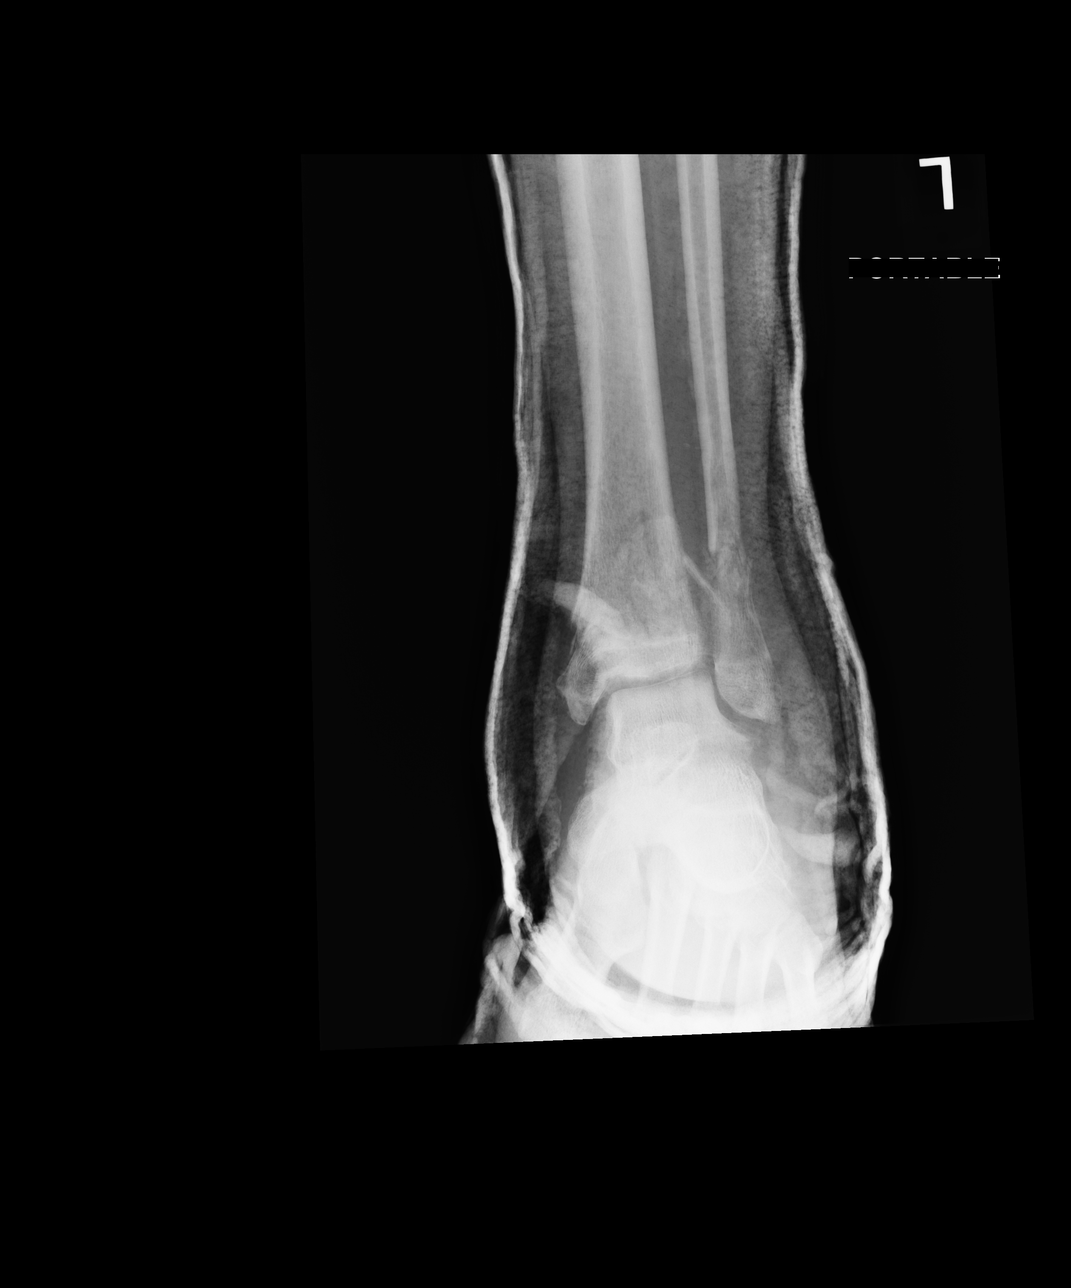

[ap obl int rot]
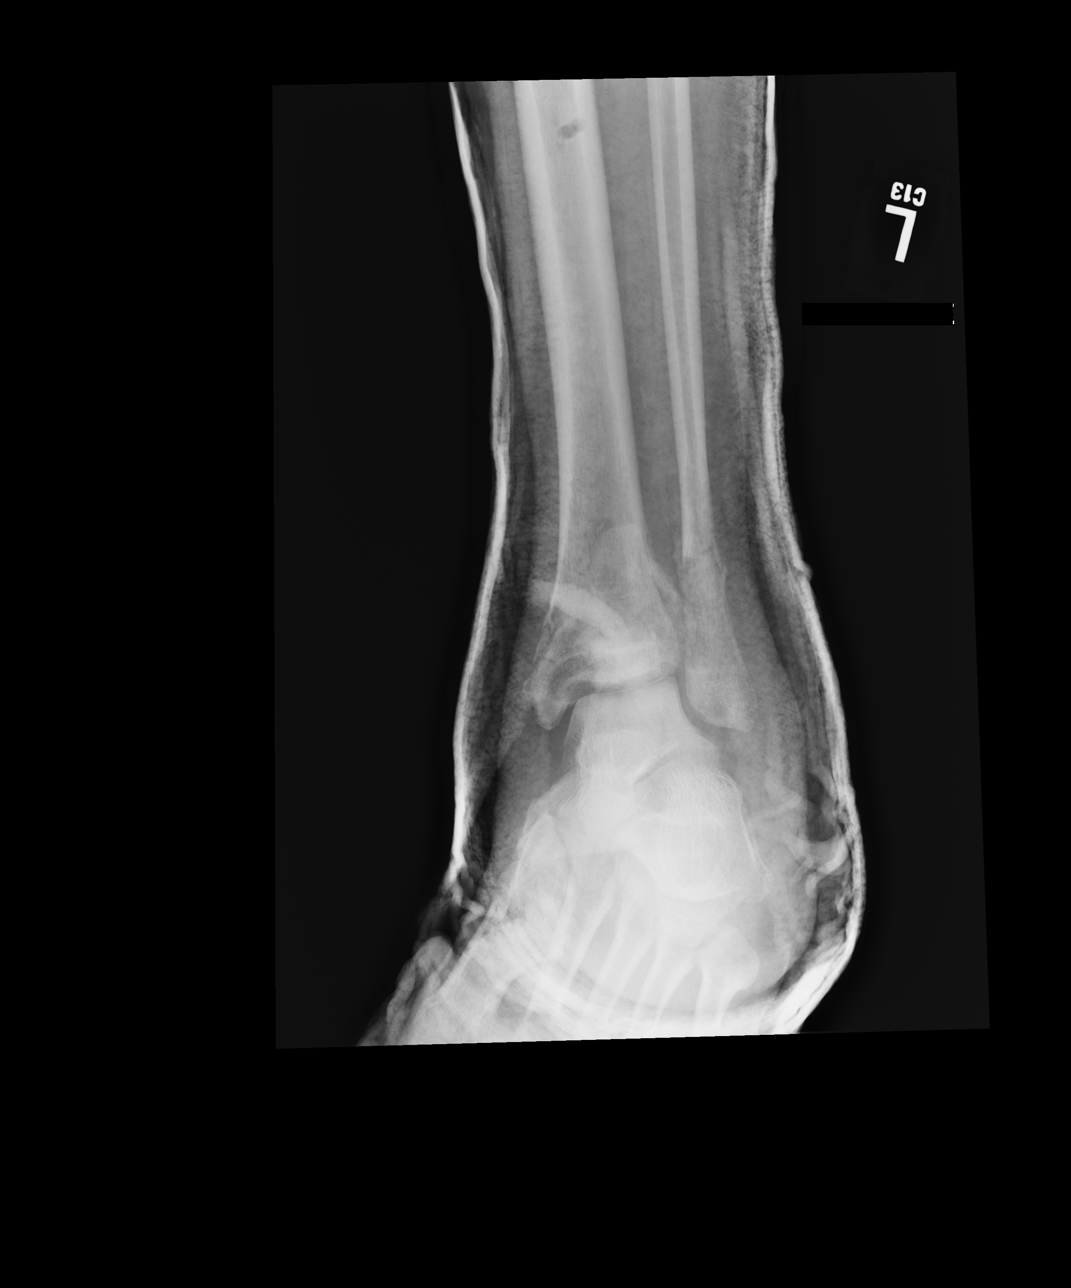

[lateral]
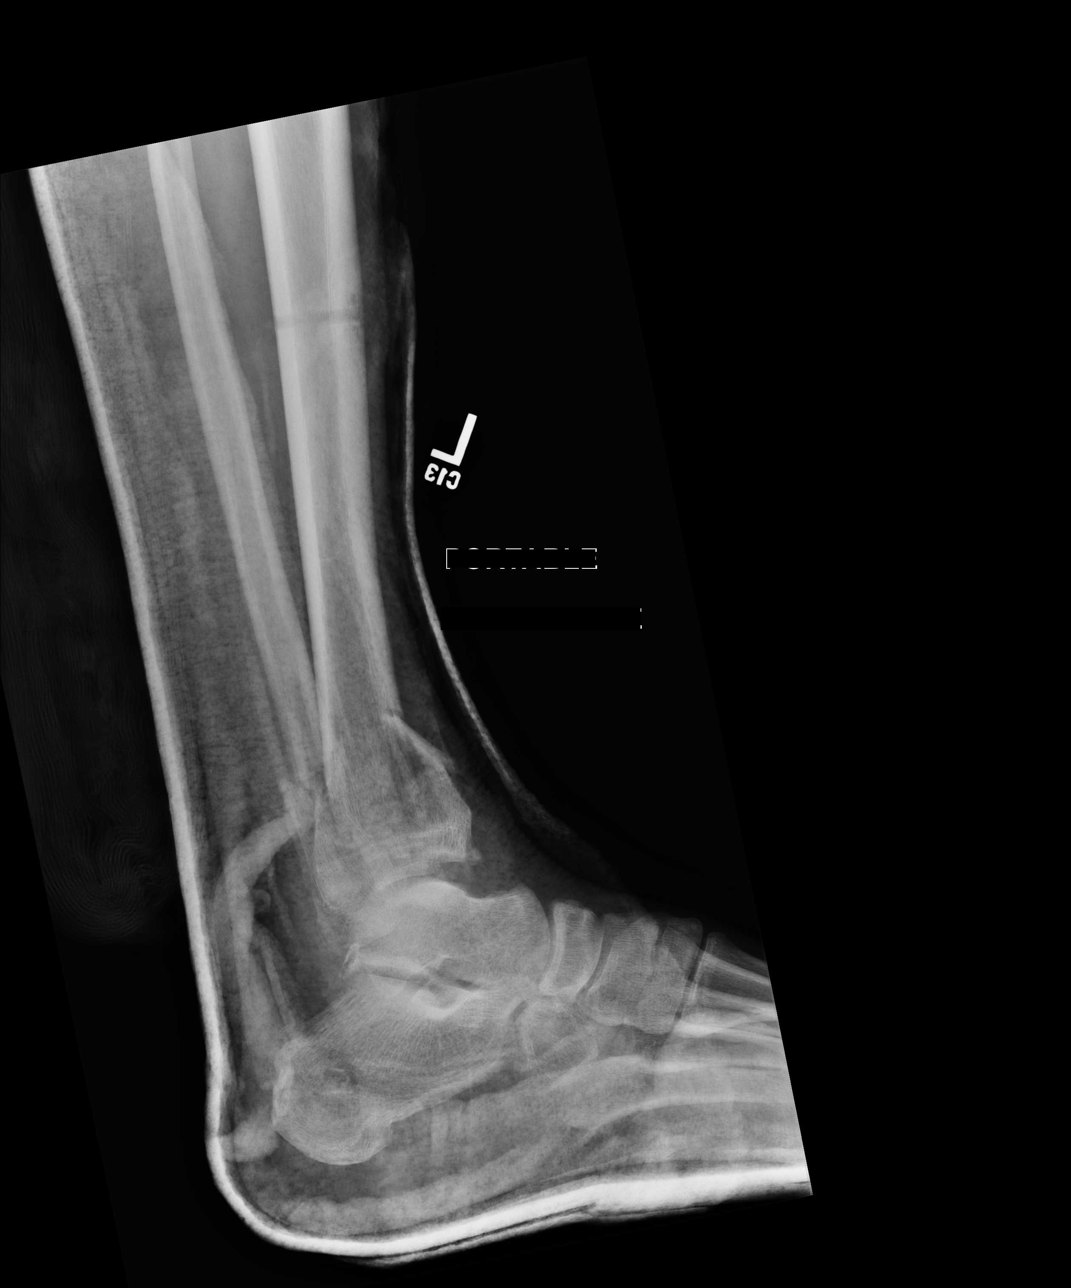

[3 of 3 positions shown; findings below may reference images not displayed]

FINDINGS: Overlying cast obscures fine bone detail. Ghost holes noted in the
distal shaft of the left tibia and in the posterior calcaneus.
Comminuted distal left fibular fracture without significant
displacement, with periosteal reaction. Comminuted intra-articular
distal left tibia fracture without significant displacement and with
suggestion of endosteal sclerosis and minimal periosteal reaction.
No subluxation at the left ankle joint. No suspicious focal osseous
lesions.
IMPRESSION: 1. No significant displacement at the comminuted healing distal left
fibula fracture.
2. No significant displacement at the comminuted intra-articular
healing distal left tibia fracture.
3. Ghost holes in the distal left tibial shaft and posterior
calcaneus from removal of external fixation hardware.
